# Patient Record
Sex: Female | Born: 1970 | Race: Black or African American | Hispanic: No | Marital: Single | Smoking: Current some day smoker
Health system: Southern US, Community
[De-identification: ages and names within clinical notes are randomized; demographics above are authoritative.]

## PROBLEM LIST (undated history)

## (undated) DIAGNOSIS — E559 Vitamin D deficiency, unspecified: Secondary | ICD-10-CM

## (undated) DIAGNOSIS — I10 Essential (primary) hypertension: Secondary | ICD-10-CM

## (undated) HISTORY — DX: Essential (primary) hypertension: I10

## (undated) HISTORY — DX: Vitamin D deficiency, unspecified: E55.9

## (undated) HISTORY — PX: TUBAL LIGATION: SHX77

---

## 1999-03-15 ENCOUNTER — Ambulatory Visit (HOSPITAL_COMMUNITY): Admission: RE | Admit: 1999-03-15 | Discharge: 1999-03-15 | Payer: Self-pay | Admitting: *Deleted

## 1999-03-22 ENCOUNTER — Encounter: Admission: RE | Admit: 1999-03-22 | Discharge: 1999-03-22 | Payer: Self-pay | Admitting: Obstetrics

## 1999-04-05 ENCOUNTER — Encounter: Admission: RE | Admit: 1999-04-05 | Discharge: 1999-04-05 | Payer: Self-pay | Admitting: Obstetrics

## 2000-05-22 ENCOUNTER — Encounter: Admission: RE | Admit: 2000-05-22 | Discharge: 2000-05-22 | Payer: Self-pay | Admitting: Obstetrics

## 2000-05-30 ENCOUNTER — Ambulatory Visit (HOSPITAL_COMMUNITY): Admission: RE | Admit: 2000-05-30 | Discharge: 2000-05-30 | Payer: Self-pay | Admitting: Obstetrics

## 2000-07-15 ENCOUNTER — Encounter: Admission: RE | Admit: 2000-07-15 | Discharge: 2000-07-15 | Payer: Self-pay | Admitting: Obstetrics & Gynecology

## 2000-08-24 ENCOUNTER — Emergency Department (HOSPITAL_COMMUNITY): Admission: EM | Admit: 2000-08-24 | Discharge: 2000-08-24 | Payer: Self-pay | Admitting: *Deleted

## 2001-02-16 ENCOUNTER — Encounter: Admission: RE | Admit: 2001-02-16 | Discharge: 2001-05-17 | Payer: Self-pay | Admitting: Obstetrics

## 2001-06-23 ENCOUNTER — Emergency Department (HOSPITAL_COMMUNITY): Admission: EM | Admit: 2001-06-23 | Discharge: 2001-06-23 | Payer: Self-pay | Admitting: Emergency Medicine

## 2001-08-05 ENCOUNTER — Emergency Department (HOSPITAL_COMMUNITY): Admission: EM | Admit: 2001-08-05 | Discharge: 2001-08-06 | Payer: Self-pay | Admitting: Emergency Medicine

## 2001-08-06 ENCOUNTER — Encounter: Payer: Self-pay | Admitting: Emergency Medicine

## 2001-08-10 ENCOUNTER — Encounter: Admission: RE | Admit: 2001-08-10 | Discharge: 2001-11-08 | Payer: Self-pay | Admitting: Obstetrics

## 2001-09-01 ENCOUNTER — Encounter: Payer: Self-pay | Admitting: Emergency Medicine

## 2001-09-01 ENCOUNTER — Emergency Department (HOSPITAL_COMMUNITY): Admission: EM | Admit: 2001-09-01 | Discharge: 2001-09-01 | Payer: Self-pay | Admitting: Emergency Medicine

## 2001-11-25 ENCOUNTER — Encounter: Admission: RE | Admit: 2001-11-25 | Discharge: 2002-02-23 | Payer: Self-pay | Admitting: Obstetrics

## 2002-03-10 ENCOUNTER — Emergency Department (HOSPITAL_COMMUNITY): Admission: EM | Admit: 2002-03-10 | Discharge: 2002-03-10 | Payer: Self-pay | Admitting: Emergency Medicine

## 2002-06-25 ENCOUNTER — Ambulatory Visit (HOSPITAL_COMMUNITY): Admission: RE | Admit: 2002-06-25 | Discharge: 2002-06-25 | Payer: Self-pay | Admitting: *Deleted

## 2002-06-25 ENCOUNTER — Encounter: Payer: Self-pay | Admitting: *Deleted

## 2002-08-11 ENCOUNTER — Encounter: Admission: RE | Admit: 2002-08-11 | Discharge: 2002-08-11 | Payer: Self-pay | Admitting: *Deleted

## 2002-09-04 ENCOUNTER — Inpatient Hospital Stay (HOSPITAL_COMMUNITY): Admission: AD | Admit: 2002-09-04 | Discharge: 2002-09-09 | Payer: Self-pay | Admitting: *Deleted

## 2002-09-05 ENCOUNTER — Encounter: Payer: Self-pay | Admitting: Family Medicine

## 2002-11-28 ENCOUNTER — Inpatient Hospital Stay (HOSPITAL_COMMUNITY): Admission: AD | Admit: 2002-11-28 | Discharge: 2002-12-01 | Payer: Self-pay | Admitting: *Deleted

## 2002-11-28 ENCOUNTER — Encounter (INDEPENDENT_AMBULATORY_CARE_PROVIDER_SITE_OTHER): Payer: Self-pay | Admitting: Specialist

## 2002-12-06 ENCOUNTER — Inpatient Hospital Stay (HOSPITAL_COMMUNITY): Admission: AD | Admit: 2002-12-06 | Discharge: 2002-12-06 | Payer: Self-pay | Admitting: Obstetrics and Gynecology

## 2004-08-31 ENCOUNTER — Inpatient Hospital Stay (HOSPITAL_COMMUNITY): Admission: AD | Admit: 2004-08-31 | Discharge: 2004-09-02 | Payer: Self-pay | Admitting: Obstetrics & Gynecology

## 2004-08-31 ENCOUNTER — Encounter (INDEPENDENT_AMBULATORY_CARE_PROVIDER_SITE_OTHER): Payer: Self-pay | Admitting: *Deleted

## 2004-08-31 ENCOUNTER — Ambulatory Visit: Payer: Self-pay | Admitting: Obstetrics & Gynecology

## 2005-10-01 ENCOUNTER — Encounter: Payer: Self-pay | Admitting: Family Medicine

## 2005-10-01 LAB — CONVERTED CEMR LAB
HCT: 32.5 %
Hemoglobin: 11 g/dL
WBC: 5.3 10*3/uL

## 2005-10-03 ENCOUNTER — Ambulatory Visit (HOSPITAL_COMMUNITY): Admission: RE | Admit: 2005-10-03 | Discharge: 2005-10-03 | Payer: Self-pay | Admitting: *Deleted

## 2005-11-28 ENCOUNTER — Inpatient Hospital Stay (HOSPITAL_COMMUNITY): Admission: AD | Admit: 2005-11-28 | Discharge: 2005-11-30 | Payer: Self-pay | Admitting: *Deleted

## 2005-11-28 ENCOUNTER — Ambulatory Visit: Payer: Self-pay | Admitting: Family Medicine

## 2006-12-09 ENCOUNTER — Emergency Department (HOSPITAL_COMMUNITY): Admission: EM | Admit: 2006-12-09 | Discharge: 2006-12-09 | Payer: Self-pay | Admitting: Family Medicine

## 2007-08-17 ENCOUNTER — Emergency Department (HOSPITAL_COMMUNITY): Admission: EM | Admit: 2007-08-17 | Discharge: 2007-08-17 | Payer: Self-pay | Admitting: Family Medicine

## 2007-08-18 ENCOUNTER — Ambulatory Visit: Payer: Self-pay | Admitting: *Deleted

## 2007-09-17 HISTORY — PX: TUBAL LIGATION: SHX77

## 2007-11-09 ENCOUNTER — Ambulatory Visit (HOSPITAL_COMMUNITY): Admission: RE | Admit: 2007-11-09 | Discharge: 2007-11-09 | Payer: Self-pay | Admitting: Family Medicine

## 2007-12-10 ENCOUNTER — Ambulatory Visit: Payer: Self-pay | Admitting: Obstetrics & Gynecology

## 2007-12-11 ENCOUNTER — Ambulatory Visit (HOSPITAL_COMMUNITY): Admission: RE | Admit: 2007-12-11 | Discharge: 2007-12-11 | Payer: Self-pay | Admitting: Family Medicine

## 2007-12-24 ENCOUNTER — Ambulatory Visit: Payer: Self-pay | Admitting: Obstetrics & Gynecology

## 2007-12-25 ENCOUNTER — Inpatient Hospital Stay (HOSPITAL_COMMUNITY): Admission: AD | Admit: 2007-12-25 | Discharge: 2007-12-25 | Payer: Self-pay | Admitting: Gynecology

## 2008-01-07 ENCOUNTER — Ambulatory Visit: Payer: Self-pay | Admitting: Family Medicine

## 2008-01-21 ENCOUNTER — Ambulatory Visit (HOSPITAL_COMMUNITY): Admission: RE | Admit: 2008-01-21 | Discharge: 2008-01-21 | Payer: Self-pay | Admitting: Obstetrics & Gynecology

## 2008-01-21 ENCOUNTER — Ambulatory Visit: Payer: Self-pay | Admitting: Obstetrics & Gynecology

## 2008-02-04 ENCOUNTER — Ambulatory Visit: Payer: Self-pay | Admitting: *Deleted

## 2008-02-12 ENCOUNTER — Ambulatory Visit: Payer: Self-pay | Admitting: Obstetrics and Gynecology

## 2008-02-12 ENCOUNTER — Inpatient Hospital Stay (HOSPITAL_COMMUNITY): Admission: AD | Admit: 2008-02-12 | Discharge: 2008-02-12 | Payer: Self-pay | Admitting: Obstetrics & Gynecology

## 2008-02-18 ENCOUNTER — Ambulatory Visit: Payer: Self-pay | Admitting: Family Medicine

## 2008-02-22 ENCOUNTER — Ambulatory Visit: Payer: Self-pay | Admitting: Family Medicine

## 2008-02-25 ENCOUNTER — Ambulatory Visit: Payer: Self-pay | Admitting: Family Medicine

## 2008-02-29 ENCOUNTER — Ambulatory Visit: Payer: Self-pay | Admitting: Obstetrics and Gynecology

## 2008-02-29 ENCOUNTER — Ambulatory Visit (HOSPITAL_COMMUNITY): Admission: RE | Admit: 2008-02-29 | Discharge: 2008-02-29 | Payer: Self-pay | Admitting: Obstetrics & Gynecology

## 2008-03-03 ENCOUNTER — Ambulatory Visit: Payer: Self-pay | Admitting: Obstetrics & Gynecology

## 2008-03-07 ENCOUNTER — Ambulatory Visit: Payer: Self-pay | Admitting: Obstetrics & Gynecology

## 2008-03-07 ENCOUNTER — Ambulatory Visit (HOSPITAL_COMMUNITY): Admission: RE | Admit: 2008-03-07 | Discharge: 2008-03-07 | Payer: Self-pay | Admitting: Family Medicine

## 2008-03-10 ENCOUNTER — Ambulatory Visit: Payer: Self-pay | Admitting: Family Medicine

## 2008-03-14 ENCOUNTER — Ambulatory Visit: Payer: Self-pay | Admitting: Obstetrics & Gynecology

## 2008-03-17 ENCOUNTER — Ambulatory Visit: Payer: Self-pay | Admitting: Family Medicine

## 2008-03-21 ENCOUNTER — Ambulatory Visit: Payer: Self-pay | Admitting: Obstetrics & Gynecology

## 2008-03-24 ENCOUNTER — Ambulatory Visit: Payer: Self-pay | Admitting: Obstetrics & Gynecology

## 2008-03-28 ENCOUNTER — Ambulatory Visit: Payer: Self-pay | Admitting: Gynecology

## 2008-03-31 ENCOUNTER — Ambulatory Visit: Payer: Self-pay | Admitting: *Deleted

## 2008-04-04 ENCOUNTER — Ambulatory Visit: Payer: Self-pay | Admitting: Obstetrics & Gynecology

## 2008-04-05 ENCOUNTER — Inpatient Hospital Stay (HOSPITAL_COMMUNITY): Admission: RE | Admit: 2008-04-05 | Discharge: 2008-04-07 | Payer: Self-pay | Admitting: *Deleted

## 2008-04-05 ENCOUNTER — Ambulatory Visit: Payer: Self-pay | Admitting: Family Medicine

## 2008-05-19 ENCOUNTER — Encounter: Payer: Self-pay | Admitting: Family Medicine

## 2008-05-19 LAB — CONVERTED CEMR LAB: Pap Smear: NORMAL

## 2008-06-30 ENCOUNTER — Emergency Department (HOSPITAL_COMMUNITY): Admission: EM | Admit: 2008-06-30 | Discharge: 2008-06-30 | Payer: Self-pay | Admitting: Emergency Medicine

## 2009-01-03 ENCOUNTER — Emergency Department (HOSPITAL_COMMUNITY): Admission: EM | Admit: 2009-01-03 | Discharge: 2009-01-03 | Payer: Self-pay | Admitting: Emergency Medicine

## 2009-01-16 ENCOUNTER — Emergency Department (HOSPITAL_COMMUNITY): Admission: EM | Admit: 2009-01-16 | Discharge: 2009-01-16 | Payer: Self-pay | Admitting: Emergency Medicine

## 2009-04-24 ENCOUNTER — Emergency Department (HOSPITAL_COMMUNITY): Admission: EM | Admit: 2009-04-24 | Discharge: 2009-04-24 | Payer: Self-pay | Admitting: Emergency Medicine

## 2009-06-06 ENCOUNTER — Emergency Department (HOSPITAL_COMMUNITY): Admission: EM | Admit: 2009-06-06 | Discharge: 2009-06-06 | Payer: Self-pay | Admitting: Emergency Medicine

## 2009-07-25 ENCOUNTER — Encounter: Payer: Self-pay | Admitting: Family Medicine

## 2009-09-14 IMAGING — US US OB FOLLOW-UP
2 series · 14 of 28 positions shown · non-contrast
Comparison: none

OBSTETRICAL ULTRASOUND:
 This ultrasound exam was performed in the [HOSPITAL] Ultrasound Department.  The OB US report was generated in the AS system, and faxed to the ordering physician.  This report is also available in [REDACTED] PACS.

[Series 1: us ob re-eval · 9 of 34 slices shown (1 of 2)]
[im 2/34]
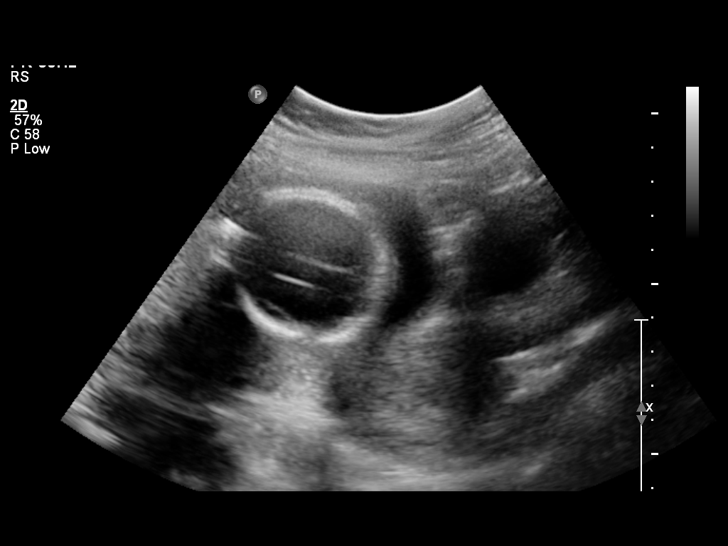
[im 6/34]
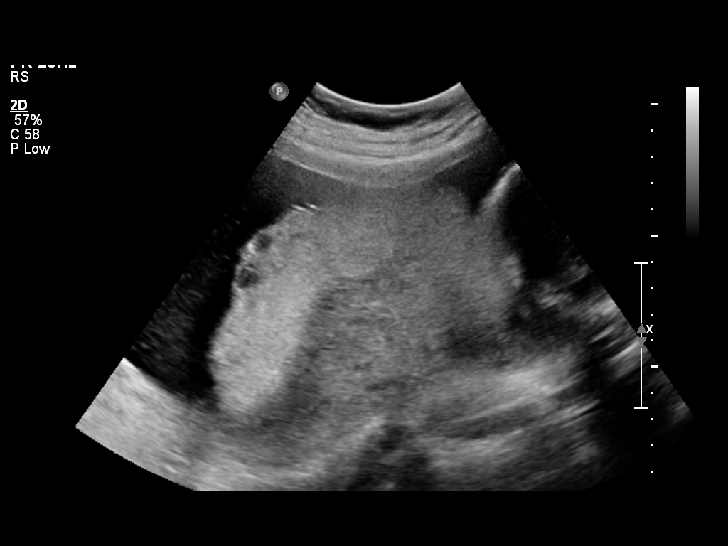
[im 10/34]
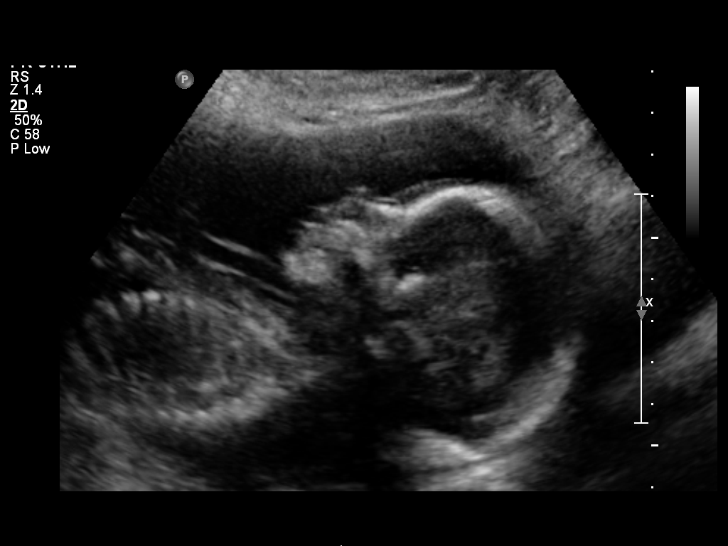
[im 13/34]
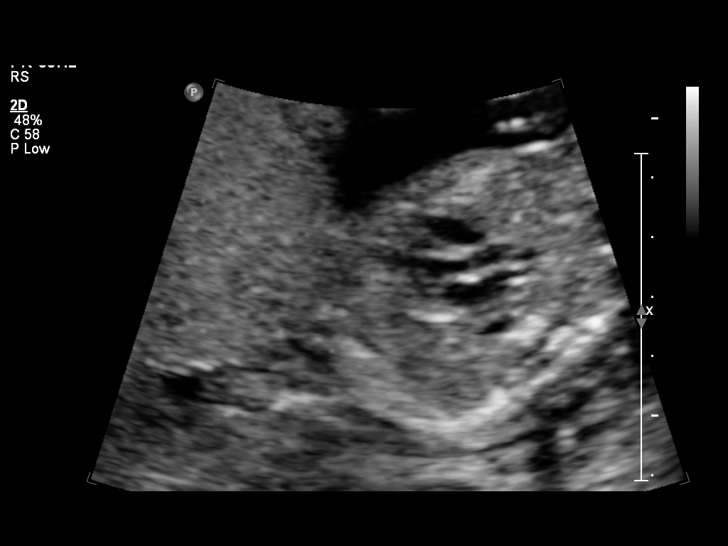
[im 17/34]
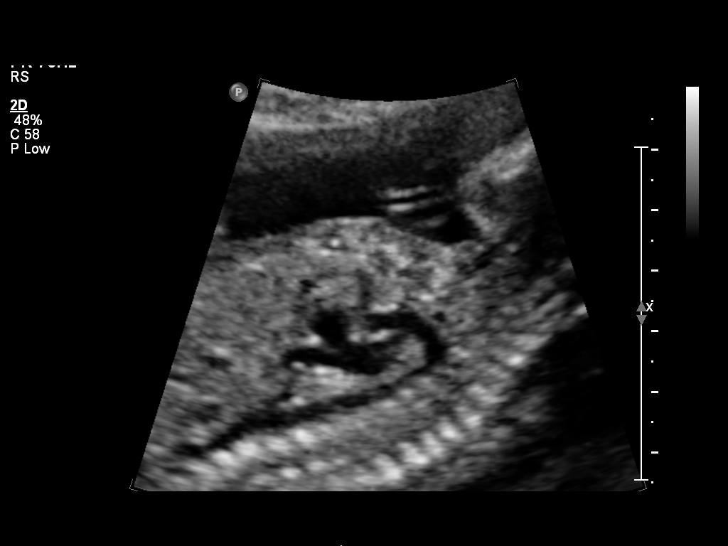
[im 21/34]
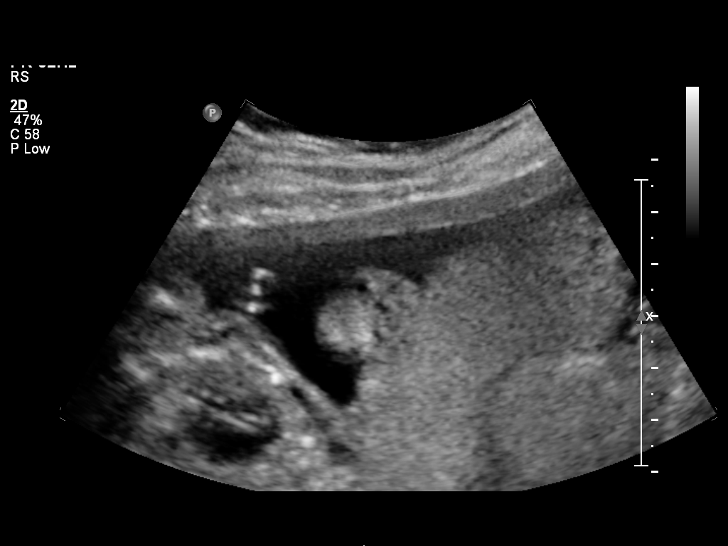
[im 24/34]
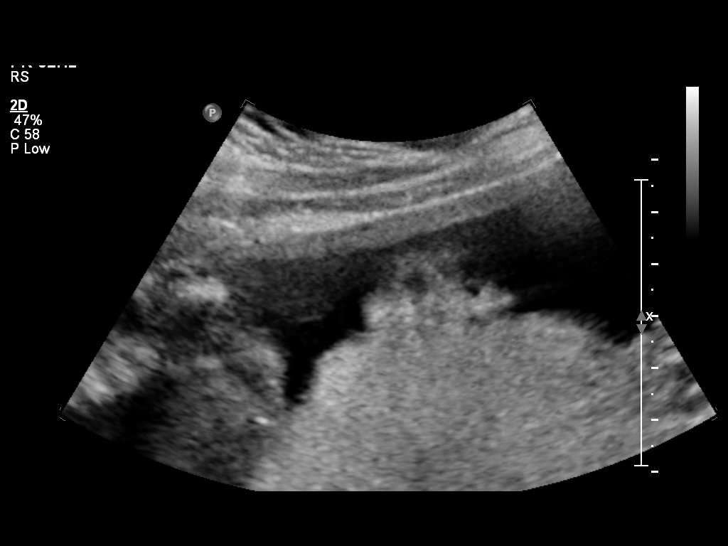
[im 28/34]
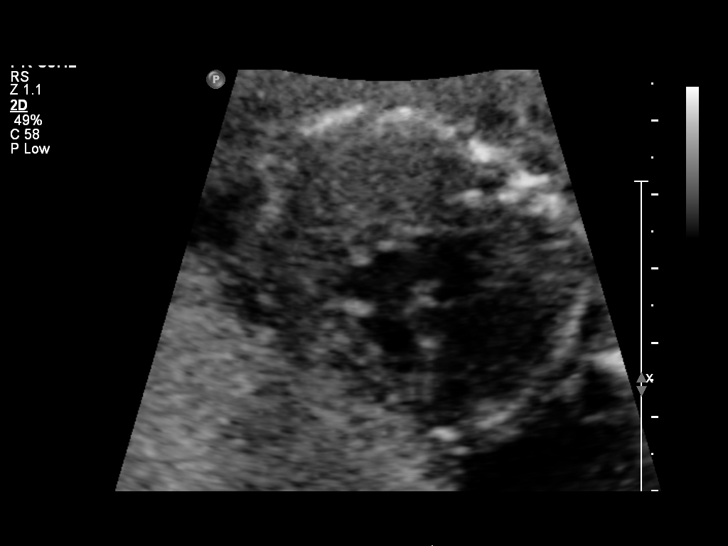
[im 32/34]
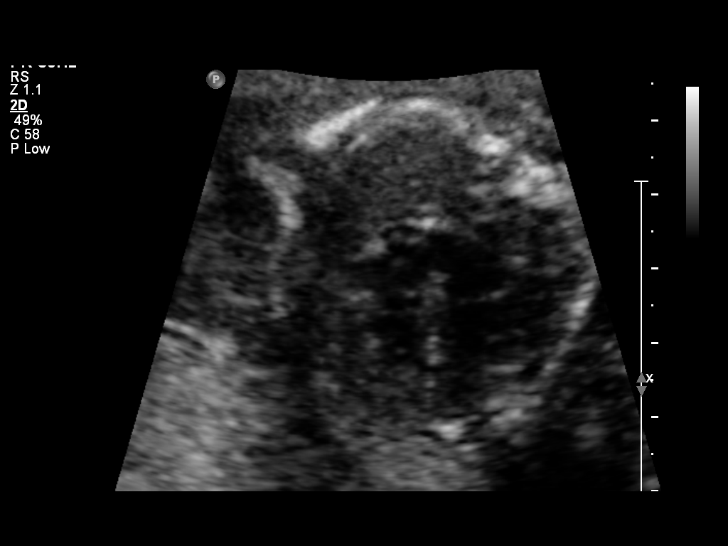

[Series 1: us ob re-eval · 5 of 15 slices shown (2 of 2)]
[im 1/15]
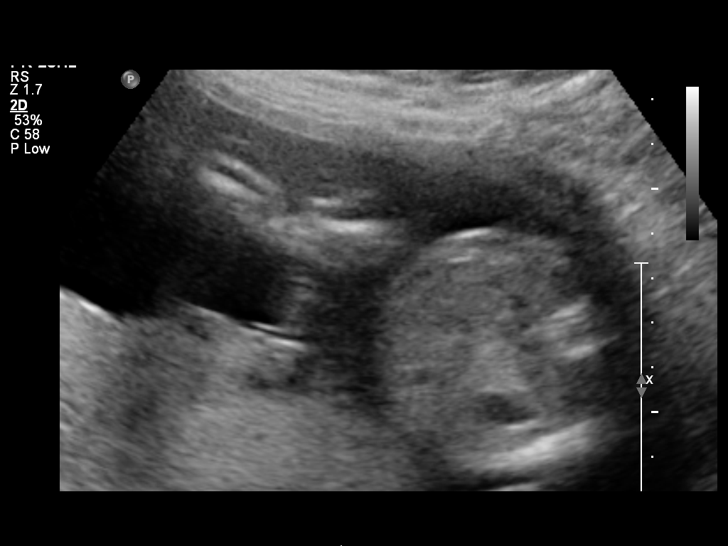
[im 4/15]
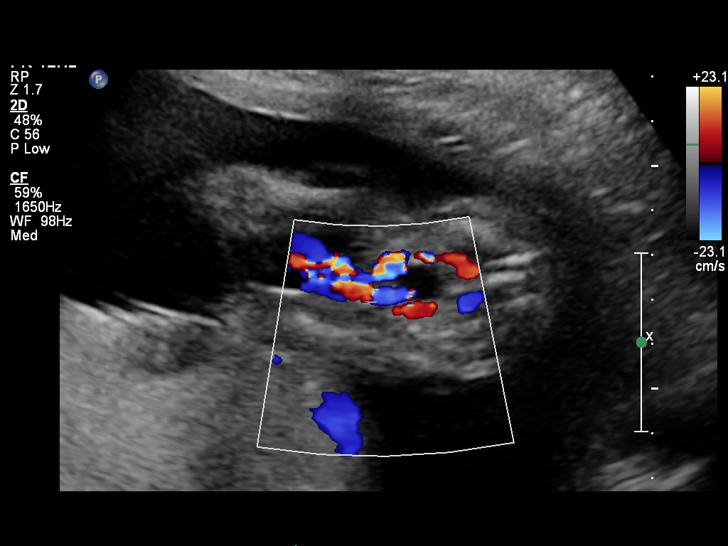
[im 8/15]
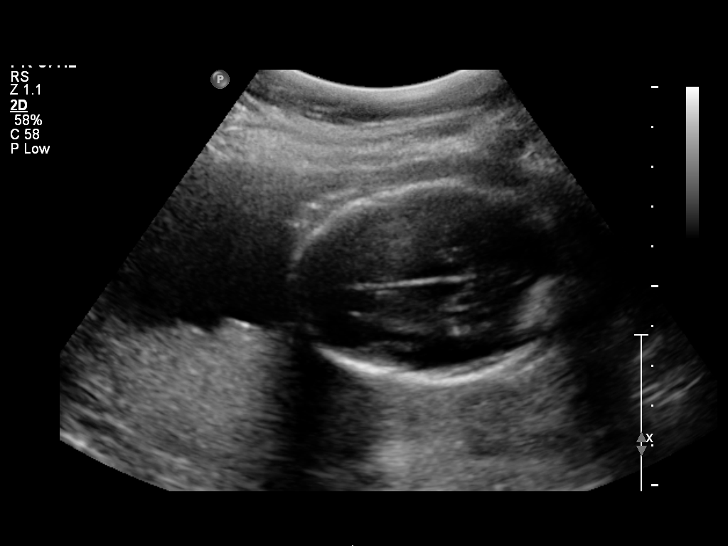
[im 11/15]
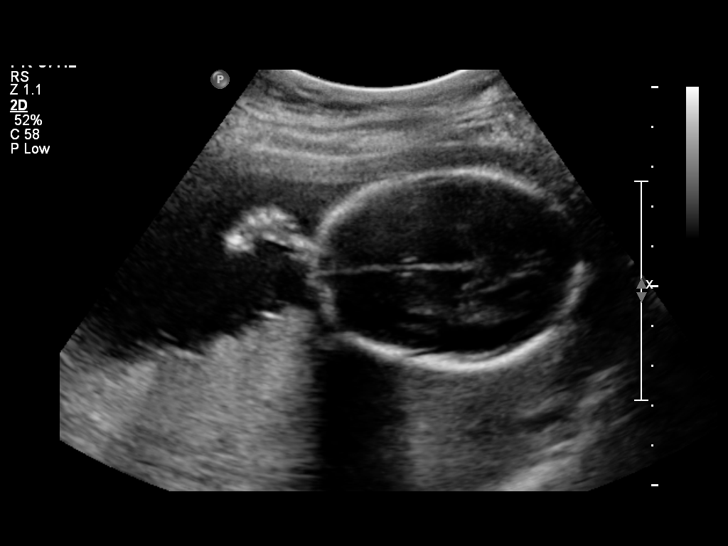
[im 15/15]
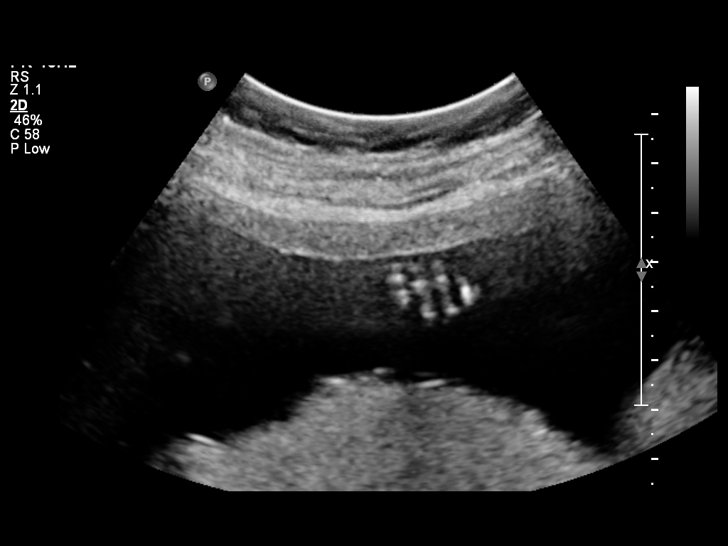

[14 of 28 positions shown; findings below may reference images not displayed]

IMPRESSION: See AS Obstetric US report.

## 2009-12-14 ENCOUNTER — Encounter: Payer: Self-pay | Admitting: Family Medicine

## 2009-12-14 ENCOUNTER — Ambulatory Visit (HOSPITAL_COMMUNITY): Admission: RE | Admit: 2009-12-14 | Discharge: 2009-12-14 | Payer: Self-pay | Admitting: Family Medicine

## 2009-12-14 ENCOUNTER — Ambulatory Visit: Payer: Self-pay | Admitting: Family Medicine

## 2009-12-14 DIAGNOSIS — F329 Major depressive disorder, single episode, unspecified: Secondary | ICD-10-CM | POA: Insufficient documentation

## 2009-12-15 ENCOUNTER — Ambulatory Visit: Payer: Self-pay | Admitting: Cardiology

## 2009-12-15 DIAGNOSIS — G4730 Sleep apnea, unspecified: Secondary | ICD-10-CM

## 2009-12-15 DIAGNOSIS — E669 Obesity, unspecified: Secondary | ICD-10-CM | POA: Insufficient documentation

## 2010-01-03 ENCOUNTER — Ambulatory Visit (HOSPITAL_BASED_OUTPATIENT_CLINIC_OR_DEPARTMENT_OTHER): Admission: RE | Admit: 2010-01-03 | Discharge: 2010-01-03 | Payer: Self-pay | Admitting: Cardiology

## 2010-01-03 ENCOUNTER — Encounter: Payer: Self-pay | Admitting: Cardiology

## 2010-01-04 ENCOUNTER — Ambulatory Visit (HOSPITAL_COMMUNITY): Admission: RE | Admit: 2010-01-04 | Discharge: 2010-01-04 | Payer: Self-pay | Admitting: Cardiology

## 2010-01-04 ENCOUNTER — Encounter: Payer: Self-pay | Admitting: Cardiology

## 2010-01-04 ENCOUNTER — Ambulatory Visit: Payer: Self-pay

## 2010-01-04 ENCOUNTER — Ambulatory Visit: Payer: Self-pay | Admitting: Cardiology

## 2010-01-11 ENCOUNTER — Telehealth: Payer: Self-pay | Admitting: Family Medicine

## 2010-01-11 ENCOUNTER — Ambulatory Visit: Payer: Self-pay | Admitting: Family Medicine

## 2010-01-11 DIAGNOSIS — K219 Gastro-esophageal reflux disease without esophagitis: Secondary | ICD-10-CM

## 2010-01-13 ENCOUNTER — Ambulatory Visit: Payer: Self-pay | Admitting: Pulmonary Disease

## 2010-01-18 ENCOUNTER — Telehealth: Payer: Self-pay | Admitting: Cardiology

## 2010-01-18 ENCOUNTER — Encounter: Payer: Self-pay | Admitting: Family Medicine

## 2010-02-09 ENCOUNTER — Ambulatory Visit: Payer: Self-pay | Admitting: Pulmonary Disease

## 2010-02-09 DIAGNOSIS — G4733 Obstructive sleep apnea (adult) (pediatric): Secondary | ICD-10-CM | POA: Insufficient documentation

## 2010-02-19 ENCOUNTER — Encounter: Payer: Self-pay | Admitting: Cardiology

## 2010-02-20 ENCOUNTER — Ambulatory Visit: Payer: Self-pay | Admitting: Cardiology

## 2010-04-06 ENCOUNTER — Ambulatory Visit: Payer: Self-pay | Admitting: Family Medicine

## 2010-04-06 DIAGNOSIS — F17200 Nicotine dependence, unspecified, uncomplicated: Secondary | ICD-10-CM

## 2010-04-06 DIAGNOSIS — M25569 Pain in unspecified knee: Secondary | ICD-10-CM

## 2010-04-28 ENCOUNTER — Encounter: Payer: Self-pay | Admitting: Pulmonary Disease

## 2010-08-09 ENCOUNTER — Encounter: Payer: Self-pay | Admitting: Pulmonary Disease

## 2010-10-18 NOTE — Letter (Signed)
Summary: CMN for CPAP Supplies/[facility]  CMN for CPAP Supplies/[facility]   Imported By: Sherian Rein 08/17/2010 08:17:11  _____________________________________________________________________  External Attachment:    Type:   Image     Comment:   External Document

## 2010-10-18 NOTE — Progress Notes (Signed)
Summary: sleep study results  Phone Note Call from Patient Call back at Home Phone [phone]   Caller: Patient Reason for Call: Talk to Nurse, Lab or Test Results Summary of Call: wants to know the results of her sleep study  Initial call taken by: Migdalia Dk,  Jan 18, 2010 8:42 AM  Follow-up for Phone Call        Mild sleep apnea. Refer to sleep MD 01/23/10--1640--referrel made to dr clance --appoint made for 02/08/10 10:30--pt notified and agrees to appoint--nt--all notes faxed--nt Follow-up by: Rollene Rotunda, MD, [employer],  Jan 22, 2010 1:49 PM

## 2010-10-18 NOTE — Assessment & Plan Note (Signed)
Summary: per check out/sf   Visit Type:  Follow-up Primary Provider:  Ardeen Garland  MD  CC:  Dyspnea.  History of Present Illness: The patient presents for evaluation of dyspnea. Her complaints are described in the previous note.  I referred her for an echocardiogram which demonstrated a well preserved ejection fraction and no significant valvular abnormalities. She also had a BNP which was normal. I did send her for a sleep study which demonstrated mild sleep apnea and she has since seen Dr. Shelle Iron.  She has just been started on CPAP. She's not having any chest pain. She has not had any PND or orthopnea. She is trying to walk more for exercise and has lost 7 pounds.  Current Medications (verified): 1)  Citalopram Hydrobromide 20 Mg Tabs (Citalopram Hydrobromide) .Marland Kitchen.. 1 Tab By Mouth By Mouth Daily For Depression 2)  Prilosec Otc 20 Mg Tbec (Omeprazole Magnesium) .... Take 1 Tab By Mouth Daily  Allergies (verified): No Known Drug Allergies  Past History:  Past Medical History: Anemia Depression - follows at the [facility] 5 children - 2 c-sections Hypertension (new) Sleep apnea  Past Surgical History: Reviewed history from 12/14/2009 and no changes required. C-section x 2 BTL in 2009  Review of Systems       As stated in the HPI and negative for all other systems.   Vital Signs:  Patient profile:   40 year old female Height:      64.5 inches Weight:      283 pounds BMI:     48.00 Pulse rate:   80 / minute Resp:     18 per minute BP sitting:   112 / 72  (left arm)  Vitals Entered By: Marrion Coy, CNA (February 20, 2010 4:24 PM)  Physical Exam  General:  Well developed, well nourished, in no acute distress.obese.   Head:  normocephalic and atraumatic Neck:  Neck supple, no JVD. No masses, thyromegaly or abnormal cervical nodes. Lungs:  Clear bilaterally to auscultation and percussion. Heart:  Non-displaced PMI, chest non-tender; regular rate and rhythm, S1, S2  without murmurs, rubs or gallops. Carotid upstroke normal, no bruit. Normal abdominal aortic size, no bruits. Femorals normal pulses, no bruits. Pedals normal pulses. No edema, no varicosities. Abdomen:  Bowel sounds positive; abdomen soft and non-tender without masses, organomegaly, or hernias noted. No hepatosplenomegaly, obese Msk:  Back normal, normal gait. Muscle strength and tone normal. Extremities:  No clubbing or cyanosis. Neurologic:  Alert and oriented x 3. Psych:  Normal affect.   Impression & Recommendations:  Problem # 1:  SHORTNESS OF BREATH (ICD-786.05) At this point I don't see a cardiac etiology. Her dyspnea is probably related to decreased exercise tolerance and morbid obesity. No further workup is suggested.  Problem # 2:  ESSENTIAL HYPERTENSION, BENIGN (ICD-401.1) Her blood pressure is controlled and she will continue the meds as listed.  Patient Instructions: 1)  Your physician recommends that you schedule a follow-up appointment as needed 2)  Your physician recommends that you continue on your current medications as directed. Please refer to the Current Medication list given to you today.

## 2010-10-18 NOTE — Letter (Signed)
Summary: [facility]  [facility]   Imported By: Lester [place] 02/23/2010 08:41:57  _____________________________________________________________________  External Attachment:    Type:   Image     Comment:   External Document

## 2010-10-18 NOTE — Assessment & Plan Note (Signed)
Summary: heartburn/Purdy/mayans   Vital Signs:  Patient profile:   41 year old female Height:      64.5 inches Weight:      290 pounds BMI:     49.19 BSA:     2.31 Temp:     98.5 degrees F Pulse rate:   75 / minute BP sitting:   123 / 85  Vitals Entered By: Jone Baseman CMA (January 11, 2010 3:06 PM) CC: heartburn x 2 days Is Patient Diabetic? No Pain Assessment Patient in pain? no        Primary Care Provider:  Ardeen Garland  MD  CC:  heartburn x 2 days.  History of Present Illness: 40 yo female coming in with a history of heartburn x 2 days.  Pain will last 2-3 hours.  Pt states that she is having pain substernal region intermittantly may be contributed to timing of food but pt states it doesn't happen all the time.  Pt states the pain stays in same regioin, does not radiate, does not cause her to be sob.  Pt denies that it is positional.  Pt has not had any vomting, no numbness or tingling.  Pt deneis waking up with a metallic taste in her mouth but is sure that this is heartburn.  Pt recently did have an ECHO done for dyspnea which was normal.  Pt has tried tums without improvement.  BNP was <30.  Habits & Providers  Alcohol-Tobacco-Diet     Tobacco Status: current     Tobacco Counseling: to quit use of tobacco products     Cigarette Packs/Day: 0.5  Current Medications (verified): 1)  Citalopram Hydrobromide 20 Mg Tabs (Citalopram Hydrobromide) .Marland Kitchen.. 1 Tab By Mouth By Mouth Daily For Depression 2)  Prilosec Otc 20 Mg Tbec (Omeprazole Magnesium) .... Take 1 Tab By Mouth Daily  Allergies (verified): No Known Drug Allergies  Past History:  Past medical, surgical, family and social histories (including risk factors) reviewed, and no changes noted (except as noted below).  Past Medical History: Reviewed history from 12/15/2009 and no changes required. Anemia Depression - follows at the [facility] 5 children - 2 c-sections Hypertension (new)  Past Surgical  History: Reviewed history from 12/14/2009 and no changes required. C-section x 2 BTL in 2009  Family History: Reviewed history from 12/15/2009 and no changes required. Father died when she was a baby - unknown cause. Not close with brother.  Mother healthy  Social History: Reviewed history from 12/14/2009 and no changes required. Was in prison for 8 months in 2000. Has 5 children.  4 live with her mother, youngest lives with her.  Quit smoking 2 years ago but then restarted 7 months ago  Review of Systems       denies fever, chills, nausea, vomiting, diarrhea or constipation   Physical Exam  General:  obese, alert, NAD vitals reviewed Eyes:  pupils equal, pupils round, and pupils reactive to light.   Mouth:  MMM Lungs:  CTAB Heart:  Normal rate and regular rhythm. S1 and S2 normal without gallop, murmur, click, rub or other extra sounds. Abdomen:  tender to palpation mid epiigastric region, BS + ND, obese Extremities:  no edema   Impression & Recommendations:  Problem # 1:  GASTROESOPHAGEAL REFLUX DISEASE, MILD (ICD-530.81) will give trial of PPI.  Consider increasing if not better.  Cardiac seemed to ruled out but if dyspnea continue would consider further w/u with holter monitor.  If doesn't improve pt to return in  1-2 weeks.   Her updated medication list for this problem includes:    Prilosec Otc 20 Mg Tbec (Omeprazole magnesium) .Marland Kitchen... Take 1 tab by mouth daily  Orders: [facility]- Est Level  3 ([address])  Complete Medication List: 1)  Citalopram Hydrobromide 20 Mg Tabs (Citalopram hydrobromide) .Marland Kitchen.. 1 tab by mouth by mouth daily for depression 2)  Prilosec Otc 20 Mg Tbec (Omeprazole magnesium) .... Take 1 tab by mouth daily Prescriptions: PRILOSEC OTC 20 MG TBEC (OMEPRAZOLE MAGNESIUM) take 1 tab by mouth daily  #32 x 3   Entered and Authorized by:   Antoine Primas DO   Signed by:   Antoine Primas DO on 01/11/2010   Method used:   Electronically to        [id number] (retail)       [address]       [place], [place]  [id number]       Ph: [phone]       Fax: [phone]   RxID:   [id number]

## 2010-10-18 NOTE — Assessment & Plan Note (Signed)
Summary: consult for osa   Copy to:  Rollene Rotunda Primary Provider/Referring Provider:  Ardeen Garland  MD  CC:  Sleep Consult.  History of Present Illness: The pt is a 40y/o female who I have been asked to see for management of osa.  She recently had npsg which showed mild osa with AHI and12/hr and desat to 83%.  She also had large numbers of leg jerks with sleep disruption, however unclear if primarily related to her sleep apnea.  The pt has been noted to have loud snoring, but no one has mentioned pauses in her breathing during sleep.  She does have frequent awakenings during the night, and is not rested upon arising in the am's.  She goes to bed at 9-10pm, and arises at 6am.  She denies any RLS symptoms, and no one has mentioned that she kicks a lot during the night.  She does clerical work, and does note sleep pressure during the day with periods of inactivity.  She does think this results in some interference with her alertness and concentration during the day.  She denies any sleepiness issues with watching tv in the evening.  She does not drive, but does get sleepy as a rider in a car.  She thinks her weight has increased the last 2 years, but unsure how much.  Her recent epworth score was normal at 5.  Medications Prior to Update: 1)  Citalopram Hydrobromide 20 Mg Tabs (Citalopram Hydrobromide) .Marland Kitchen.. 1 Tab By Mouth By Mouth Daily For Depression 2)  Prilosec Otc 20 Mg Tbec (Omeprazole Magnesium) .... Take 1 Tab By Mouth Daily  Allergies (verified): No Known Drug Allergies  Past History:  Past Medical History: Reviewed history from 12/15/2009 and no changes required. Anemia Depression - follows at the [facility] 5 children - 2 c-sections Hypertension (new)  Past Surgical History: Reviewed history from 12/14/2009 and no changes required. C-section x 2 BTL in 2009  Family History: Reviewed history from 12/15/2009 and no changes required. Father died when she was a baby -  unknown cause. Not close with brother.  Mother healthy maternal grandmother with breast cancer  Social History: Reviewed history from 12/14/2009 and no changes required. Was in prison for 8 months in 2000.   pt is single. pt has children. pt works in Warehouse manager. pt is a current smoker.  less than 1 ppd.  started at age 63.   Review of Systems       The patient complains of shortness of breath with activity, chest pain, acid heartburn, headaches, and hand/feet swelling.  The patient denies shortness of breath at rest, productive cough, non-productive cough, coughing up blood, irregular heartbeats, indigestion, loss of appetite, weight change, abdominal pain, difficulty swallowing, sore throat, tooth/dental problems, nasal congestion/difficulty breathing through nose, sneezing, itching, ear ache, anxiety, depression, joint stiffness or pain, rash, change in color of mucus, and fever.    Vital Signs:  Patient profile:   40 year old female Height:      64.5 inches Weight:      285 pounds BMI:     48.34 O2 Sat:      97 % on Room air Temp:     98.3 degrees F oral Pulse rate:   101 / minute BP sitting:   124 / 70  (left arm) Cuff size:   large  Vitals Entered By: Arman Filter LPN (Feb 09, 2010 1:32 PM)  O2 Flow:  Room air CC: Sleep Consult Comments Medications reviewed with patient  Aundra Millet Reynolds LPN  Feb 09, 2010 1:35 PM    Physical Exam  General:  obese female in nad Eyes:  PERRLA and EOMI.   Nose:  turbinate hypertrophy, no obstruction Mouth:  long palate and uvula, no tonsillar hypertrophy Neck:  no jvd, tmg, LN Lungs:  clear to auscultation Heart:  rrr, no mrg Abdomen:  soft and nontender, bs+ Extremities:  no significant edema, pulses intact distally no cyanosis Neurologic:  alert and oriented, moves all 4.   Impression & Recommendations:  Problem # 1:  OBSTRUCTIVE SLEEP APNEA (ICD-327.23) the pt has mild osa by her recent sleep study, and is symptomatic at night  and somewhat during the day.  I have discussed with her weight loss, upper airway surgery, dental appliance, and cpap.  Given the mild nature of her disease, at least this isn't a CV risk for her.  I would base the aggressiveness of treatment on the degree this is affecting her QOL.  The pt would like to treat her osa more aggessively with cpap while trying to work on weight loss.  Will set her up on cpap and have her f/u in 4 weeks.  If she continues to have nonrestorative sleep despite therapeutic cpap, would consider whether her leg movements could be an issue.  Other Orders: Consultation Level IV ([address]109) DME Referral (DME)  Patient Instructions: 1)  will set up on cpap as we discussed.  Please call if having tolerance issues 2)  work on weight loss 3)  keep in mind leg kicks, and let me know if you are having these. 4)  followup with me in 4 weeks.

## 2010-10-18 NOTE — Miscellaneous (Signed)
Summary: Orders Update  Clinical Lists Changes  Orders: Added new Test order of T-BNP  (B Natriuretic Peptide) ([id number]) - Signed 

## 2010-10-18 NOTE — Letter (Signed)
Summary: CMN/[facility]  CMN/[facility]   Imported By: Lester [place] 05/02/2010 09:43:09  _____________________________________________________________________  External Attachment:    Type:   Image     Comment:   External Document

## 2010-10-18 NOTE — Assessment & Plan Note (Signed)
Summary: f/u/kh   Vital Signs:  Patient profile:   40 year old female Height:      64.5 inches Weight:      278 pounds BMI:     47.15 BSA:     2.26 Temp:     98.2 degrees F Pulse rate:   66 / minute BP sitting:   121 / 81  Vitals Entered By: Jone Baseman CMA (April 06, 2010 1:41 PM) CC: f/u Is Patient Diabetic? No Pain Assessment Patient in pain? yes     Location: left knee Intensity: 7   Referring Provider:  Rollene Rotunda Primary Provider:  Demetria Pore MD  CC:  f/u.  History of Present Illness: Pt presents today with complaint of knee pain and need for prilosec and citalapram refills. 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 The pain is in both knees. It is in the back of her left knee and along the MCL joint area. Her left knee pain is more sharp in nature. Her right kne pain is diffusely around the front of the knee (patellar) and is more dull. She notes some mild BLE swelling at times. She says her pain is bad all of the time, but worst in the morning when she first wakes up and it gradually gets a little better as the day goes on and she is moving more. She has tried Advil, Tylenol, and Motrin as well as Icy-Hot, none of which have given her any relief. No numbness or tingling. She notes some swelling in her right knee at times. No muscle weakness. No falls or recent trauma. Selena Batten has lost 5lbs!  Current Problems (verified): 1)  Cigarette Smoker  (ICD-305.1) 2)  Knee Pain, Bilateral  (ICD-719.46) 3)  Obstructive Sleep Apnea  (ICD-327.23) 4)  Gastroesophageal Reflux Disease, Mild  (ICD-530.81) 5)  Obesity, Unspecified  (ICD-278.00) 6)  Sleep Apnea  (ICD-780.57) 7)  Depression  (ICD-311)  Current Medications (verified): 1)  Citalopram Hydrobromide 20 Mg Tabs (Citalopram Hydrobromide) .Marland Kitchen.. 1 Tab By Mouth By Mouth Daily For Depression 2)  Prilosec Otc 20 Mg Tbec  (Omeprazole Magnesium) .... Take 1 Tab By Mouth Daily 3)  Voltaren-Xr 100 Mg Xr24h-Tab (Diclofenac Sodium) .... Take 1 Tab By Mouth Two Times A Day  Allergies (verified): No Known Drug Allergies  Physical Exam  General:  alert, well-developed, well-nourished, well-hydrated, and overweight-appearing.   Lungs:  normal respiratory effort, normal breath sounds, no crackles, and no wheezes.   Heart:  normal rate, regular rhythm, no murmur, no gallop, and no rub.   Abdomen:  non-tender and normal bowel sounds.   Msk:  normal ROM, no joint tenderness, no joint swelling, no joint deformities, and no crepitation.  no TTP along posterior knee bilat or along patella or ligaments.  Pulses:  R dorsalis pedis normal and L dorsalis pedis normal.   Neurologic:  alert & oriented X3.  strength normal in BLE.    Impression & Recommendations:  Problem # 1:  KNEE PAIN, BILATERAL (ICD-719.46) Assessment New  Appears to be arthritic pain. Will have pt f/u in 1 month to discuss. If it has not improved at that time, will consider standing AP and lateral knee films and sports medicine clinic referral. Discussed the risks of long term NSAID therapy. Encouraged weight loss.  Her updated medication list for this problem includes:    Voltaren-xr 100 Mg Xr24h-tab (Diclofenac  sodium) .Marland Kitchen... Take 1 tab by mouth two times a day  Orders: [facility]- Est  Level 4 (62130)  Problem # 2:  DEPRESSION (ICD-311)  Stable. Medication refilled.  Her updated medication list for this problem includes:    Citalopram Hydrobromide 20 Mg Tabs (Citalopram hydrobromide) .Marland Kitchen... 1 tab by mouth by mouth daily for depression  Orders: [facility]- Est  Level 4 (86578)  Problem # 3:  GASTROESOPHAGEAL REFLUX DISEASE, MILD (ICD-530.81)  Stable. Medication refilled.  Her updated medication list for this problem includes:    Prilosec Otc 20 Mg Tbec (Omeprazole magnesium) .Marland Kitchen... Take 1 tab by mouth daily  Orders: [facility]- Est  Level 4 (46962)  Problem #  4:  OBESITY, UNSPECIFIED (ICD-278.00)  Pt has lost 5 lb. Praised pt for this change and encouraged her to continue.   Orders: [facility]- Est  Level 4 (99214)  Problem # 5:  CIGARETTE SMOKER (ICD-305.1)  1/2 ppd. Slightly increased. Encouraged her to start thinking about quitting. Will continue addressing this issue at future visits.   Orders: [facility]- Est  Level 4 (95284)  Complete Medication List: 1)  Citalopram Hydrobromide 20 Mg Tabs (Citalopram hydrobromide) .Marland Kitchen.. 1 tab by mouth by mouth daily for depression 2)  Prilosec Otc 20 Mg Tbec (Omeprazole magnesium) .... Take 1 tab by mouth daily 3)  Voltaren-xr 100 Mg Xr24h-tab (Diclofenac sodium) .... Take 1 tab by mouth two times a day  Patient Instructions: 1)  I have refilled your prilosec and citalopram prescriptions. 2)  I have given you a new prescription for an anti-inflammatory medication to help with the pain in your knees.  3)  Please schedule a follow-up appointment in 1 month so we can talk about how your knee pain is doing. 4)  Tobacco is very bad for your health and your loved ones! You Should stop smoking!. 5)  Great job on loosing 5 pounds, keep up the great work! 6)  It was great meeting you! Prescriptions: VOLTAREN-XR 100 MG XR24H-TAB (DICLOFENAC SODIUM) Take 1 tab by mouth two times a day  #70 x 0   Entered and Authorized by:   Demetria Pore MD   Signed by:   Demetria Pore MD on 04/06/2010   Method used:   Print then Give to Patient   RxID:   [id number] PRILOSEC OTC 20 MG TBEC (OMEPRAZOLE MAGNESIUM) take 1 tab by mouth daily  #30 x 3   Entered and Authorized by:   Demetria Pore MD   Signed by:   Demetria Pore MD on 04/06/2010   Method used:   Print then Give to Patient   RxID:   [id number] CITALOPRAM HYDROBROMIDE 20 MG TABS (CITALOPRAM HYDROBROMIDE) 1 tab by mouth by mouth daily for depression  #30 x 5   Entered and Authorized by:   Demetria Pore MD   Signed by:   Demetria Pore MD on  04/06/2010   Method used:   Print then Give to Patient   RxID:   [id number]

## 2010-10-18 NOTE — Miscellaneous (Signed)
Summary: Outside records  Clinical Lists Changes  Observations: Added new observation of PAP SMEAR: normal (07/25/2009 10:41) Added new observation of PAP SMEAR: normal (05/19/2008 10:41) Added new observation of PLATELETK/UL: 228 K/uL (10/01/2005 10:41) Added new observation of HCT: 32.5 % (10/01/2005 10:41) Added new observation of HGB: 11.0 g/dL (16/06/9603 54:09) Added new observation of WBC COUNT: 5.3 10*3/microliter (10/01/2005 10:41)

## 2010-10-18 NOTE — Miscellaneous (Signed)
Clinical Lists Changes  Observations: Added new observation of ECHOINTERP:  - Left ventricle: The cavity size was normal. Wall thickness was       increased in a pattern of mild LVH. Systolic function was normal.       The estimated ejection fraction was in the range of 55% to 60%.       Wall motion was normal; there were no regional wall motion       abnormalities. Features are consistent with a pseudonormal left       ventricular filling pattern, with concomitant abnormal relaxation       and increased filling pressure (grade 2 diastolic dysfunction).     - Left atrium: The atrium was mildly dilated. (01/04/2010 9:27) Added new observation of SLEEP STUDY:  1. Mild obstructive sleep apnea/hypopnea syndrome with an AH I of 12       events per hour and oxygen desaturation as low as 83%.  The patient       did not meet split-night criteria secondary to the majority of her       events occurring well after midnight.  Treatment for this degree of       sleep apnea can include weight loss alone, upper airway surgery,       dental appliance, and also a CPAP.  The decision to treat this       degree of sleep apnea aggressively should be dependent upon its       impact to the patient's quality of life.   2. Large numbers of periodic leg movements with mild sleep disruption.       It is unclear whether this is related to       the patient's sleep disordered breathing or whether she may have a       concomitant primary movement disorder of sleep.  Clinical       correlation is suggested after the patient is adequately treated       her obstructive sleep apnea.           (01/03/2010 9:29)      Echocardiogram  Procedure date:  01/04/2010  Findings:       - Left ventricle: The cavity size was normal. Wall thickness was       increased in a pattern of mild LVH. Systolic function was normal.       The estimated ejection fraction was in the range of 55% to 60%.       Wall motion was  normal; there were no regional wall motion       abnormalities. Features are consistent with a pseudonormal left       ventricular filling pattern, with concomitant abnormal relaxation       and increased filling pressure (grade 2 diastolic dysfunction).     - Left atrium: The atrium was mildly dilated.  Sleep Study  Procedure date:  01/03/2010  Findings:       1. Mild obstructive sleep apnea/hypopnea syndrome with an AH I of 12       events per hour and oxygen desaturation as low as 83%.  The patient       did not meet split-night criteria secondary to the majority of her       events occurring well after midnight.  Treatment for this degree of       sleep apnea can include weight loss alone, upper airway surgery,       dental appliance, and also  a CPAP.  The decision to treat this       degree of sleep apnea aggressively should be dependent upon its       impact to the patient's quality of life.   2. Large numbers of periodic leg movements with mild sleep disruption.       It is unclear whether this is related to       the patient's sleep disordered breathing or whether she may have a       concomitant primary movement disorder of sleep.  Clinical       correlation is suggested after the patient is adequately treated       her obstructive sleep apnea.

## 2010-10-18 NOTE — Progress Notes (Signed)
Summary: triage  Phone Note Call from Patient Call back at Home Phone [phone]   Caller: Patient Summary of Call: having heartburn real bad yesterday & today - not sure if she should come in today Initial call taken by: De Nurse,  January 11, 2010 1:36 PM  Follow-up for Phone Call        has tried tums. wants to be seen. work in at 3. aware of wait Follow-up by: Golden Circle RN,  January 11, 2010 1:44 PM

## 2010-10-18 NOTE — Assessment & Plan Note (Signed)
Summary: np6/dx:CP/dypsnea on excertion/bilateral leg edema/lg   Visit Type:  Follow-up Primary Provider:  Ardeen Garland  MD  CC:  Dyspnea.  History of Present Illness: The patient presents for evaluation of shortness of breath.  She has no prior cardiac history. However, slowly over many months or years she has had increasing shortness of breath. She is now short of breath walking 25 yards on level ground. She does not report an abrupt change. She is not describing new PND or orthopnea. She knows that she does snore in her sleep. She has daytime somnolence and headaches. She does get chest discomfort. This is episodic stabbing and squeezing discomfort that occurs at rest. It may happen with different positions. It can last for several minutes at a time. She cannot bring this on with activity. She does not have associated nausea vomiting or diaphoresis with the discomfort though she does have diaphoresis otherwise as a routine. She has had slow weight gain. She's had no prior cardiac workup.  Current Medications (verified): 1)  Citalopram Hydrobromide 20 Mg Tabs (Citalopram Hydrobromide) .Marland Kitchen.. 1 Tab By Mouth By Mouth Daily For Depression  Allergies (verified): No Known Drug Allergies  Past History:  Past Medical History: Anemia Depression - follows at the [facility] 5 children - 2 c-sections Hypertension (new)  Past Surgical History: Reviewed history from 12/14/2009 and no changes required. C-section x 2 BTL in 2008/02/08  Family History: Father died when she was a baby - unknown cause. Not close with brother.  Mother healthy  Social History: Reviewed history from 12/14/2009 and no changes required. Was in prison for 8 months in 1999-02-08. Has 5 children.  4 live with her mother, youngest lives with her.  Quit smoking 2 years ago but then restarted 7 months ago  Review of Systems       As stated in the HPI and negative for all other systems.   Vital Signs:  Patient profile:    40 year old female Height:      64.5 inches Weight:      286 pounds BMI:     48.51 Pulse rate:   100 / minute Resp:     16 per minute BP sitting:   168 / 102  (right arm)  Vitals Entered By: Marrion Coy, CNA (December 15, 2009 2:57 PM)  Physical Exam  General:  Well developed, well nourished, in no acute distress. Head:  normocephalic and atraumatic Eyes:  PERRLA/EOM intact; conjunctiva and lids normal. Mouth:  Teeth, gums and palate normal. Oral mucosa normal. Neck:  Neck supple, no JVD. No masses, thyromegaly or abnormal cervical nodes. Chest Wall:  no deformities or breast masses noted Lungs:  Clear bilaterally to auscultation and percussion. Abdomen:  Bowel sounds positive; abdomen soft and non-tender without masses, organomegaly, or hernias noted. No hepatosplenomegaly, morbidly obese Msk:  Back normal, normal gait. Muscle strength and tone normal. Extremities:  No clubbing or cyanosis. Neurologic:  Alert and oriented x 3. Skin:  Intact without lesions or rashes. Cervical Nodes:  no significant adenopathy Axillary Nodes:  no significant adenopathy Inguinal Nodes:  no significant adenopathy Psych:  Normal affect.   Detailed Cardiovascular Exam  Neck    Carotids: Carotids full and equal bilaterally without bruits.      Neck Veins: Normal, no JVD.    Heart    Inspection: no deformities or lifts noted.      Palpation: normal PMI with no thrills palpable.      Auscultation: regular rate  and rhythm, S1, S2 without murmurs, rubs, gallops, or clicks.    Vascular    Abdominal Aorta: no palpable masses, pulsations, or audible bruits.      Femoral Pulses: normal femoral pulses bilaterally.      Pedal Pulses: normal pedal pulses bilaterally.      Radial Pulses: normal radial pulses bilaterally.      Peripheral Circulation: no clubbing, cyanosis, or edema noted with normal capillary refill.     Impression & Recommendations:  Problem # 1:  SHORTNESS OF BREATH (ICD-786.05) I  suspect that her shortness of breath is multifactorial. I will start with a BNP level and echocardiogram. If these are normal I might not suggest further cardiac workup. Other issues need to be addressed as below. Orders: Echocardiogram (Echo) EKG w/ Interpretation (93000) Sleep Disorder Referral (Sleep Disorder)  Problem # 2:  OBESITY, UNSPECIFIED (ICD-278.00) I had a long discussion with this patient about the need for weight loss and the dangers of her morbid obesity. I gave her very specific instructions on diet and exercise she can begin now even as we are in the midst of this workup.  Problem # 3:  SLEEP APNEA (ICD-780.57) Sheet almost definitely has sleep apnea and I will order a sleep study. Orders: Sleep Disorder Referral (Sleep Disorder)  Problem # 4:  CHEST PAIN (ICD-786.50) This is atypical. I will consider stress testing in the future. Orders: Echocardiogram (Echo) EKG w/ Interpretation (93000)  Problem # 5:  ESSENTIAL HYPERTENSION, BENIGN (ICD-401.1) Her blood pressure is elevated today but he wasn't yesterday. We had to take a wrist measurement. This may be related to sleep apnea or a spurious reading. We will need to follow this closely and determine the most accurate means of measurement. She needs weight loss as above.  Other Orders: TLB-BNP (B-Natriuretic Peptide) (83880-BNPR)  Patient Instructions: 1)  Your physician recommends that you schedule a follow-up appointment in: 2 months with Dr Antoine Poche 2)  Your physician recommends that you have lab work today:  BNP 786.05 3)  Your physician recommends that you continue on your current medications as directed. Please refer to the Current Medication list given to you today. 4)  Your physician has requested that you have an echocardiogram.  Echocardiography is a painless test that uses sound waves to create images of your heart. It provides your doctor with information about the size and shape of your heart and how well  your heart's chambers and valves are working.  This procedure takes approximately one hour. There are no restrictions for this procedure. 5)  Your physician has recommended that you have a sleep study.  This test records several body functions during sleep, including:  brain activity, eye movement, oxygen and carbon dioxide blood levels, heart rate and rhythm, breathing rate and rhythm, the flow of air through your mouth and nose, snoring, body muscle movements, and chest and belly movement.  Appended Document: np6/dx:CP/dypsnea on excertion/bilateral leg edema/lg NL.  Awaiting echo results.

## 2010-10-18 NOTE — Assessment & Plan Note (Signed)
Summary: Np,tcb  ROI FAXED TO [facility] medical records: [phone].Arlyss Repress CMA,  December 14, 2009 10:11 AM   Vital Signs:  Patient profile:   40 year old female Height:      64.5 inches Weight:      286 pounds BMI:     48.51 Pulse rate:   74 / minute BP sitting:   134 / 70  Vitals Entered By: Arlyss Repress CMA, (December 14, 2009 9:08 AM) CC: new pt. c/o swelling of ankle and feet x 1 week. head aches x 4 days..sharp pain across forehead. denies light sensitivity, nausea. last pap 07-2009. last Tdap 1999-01-31. Is Patient Diabetic? No Pain Assessment Patient in pain? no        CC:  new pt. c/o swelling of ankle and feet x 1 week. head aches x 4 days..sharp pain across forehead. denies light sensitivity and nausea. last pap 07-2009. last Tdap Jan 31, 1999.Marland Kitchen  History of Present Illness: Ms. Slotnick comes in today as a new patient to our practice.  [facility]  She is here to establish care.  SHe complains of lower extremity swelling and headaches. 1) LE swelling - started about 2 weeks ago.  Worse in the evenings.  Sometimes better in the morning, sometimes not.  Is pitting -  leaves large sock line.  Over same time frame has been having dyspnea with walking up stairs.  Patient states this is new.  DId not used to get winded walking up one flight of stairs.  Does have occassional chest pain with it, but describes it as sharp and shooting, lasting only seconds.  No known history of heart disease but of note her father died when she was a baby and she does not know what of.  She is not close to her brother and so does not know his health.  Her mother is healthy.  SHe is obese.  No history of hypertension or diabetes.  Does smoke.  Used to smoke for years, quit for 2 years, and restarted 7 months ago.   2) Headahce - has history of occassional headaches.  However, for last week had a headache for a week.  Would take ibuprofen 800 and it would go away but then come  back.  Was like her "normal headache" except that it kept coming back.  Was taking ibuprofen about 3 times a day.  Not associated with nausea, vomitting, phonophobia, or photophobia.  No visual changes, numbness, weakness, or tingling.  Headache is gone today.  3) Preventive health - last pap in 11/10 at [facility].  Normal per her report.  History of 1 abnormal pap smear about 10 years ago, but denies having a colposcopy done.  Never had mammogram (only 6 yo).  Grandmother diagnosed with breast cancer in her 37s.  No other history of breast cancer.   Habits & Providers  Alcohol-Tobacco-Diet     Tobacco Status: current     Tobacco Counseling: to quit use of tobacco products     Cigarette Packs/Day: 0.5  Past History:  Past Medical History: Anemia Depression - follows at the [facility] 5 children - 2 c-sections  Past Surgical History: C-section x 2 BTL in 01/31/2008  Family History: Father died when she was a baby - unknown cause. Not close with brother  Mother healthy  Social History: Was in prison for 8 months in 1999/01/31. Has 5 children.  4 live with her mother, youngest lives with her.  Quit smoking 2 years ago but then restarted 7 months agoSmoking Status:  current Packs/Day:  0.5  Physical Exam  General:  obese, alert, NAD vitals reviewed Eyes:  pupils equal, pupils round, and pupils reactive to light.   Ears:  R ear normal and L ear normal.   Nose:  External nasal examination shows no deformity or inflammation. Nasal mucosa are pink and moist without lesions or exudates. Mouth:  Oral mucosa and oropharynx without lesions or exudates.  Teeth in good repair. Lungs:  Normal respiratory effort, chest expands symmetrically. Lungs are clear to auscultation, no crackles or wheezes. Heart:  Normal rate and regular rhythm. S1 and S2 normal without gallop, murmur, click, rub or other extra sounds. Pulses:  2+ radial  Extremities:  trace edema of lower extremities Neurologic:  alert &  oriented X3, cranial nerves II-XII intact, strength normal in all extremities, and gait normal.   Psych:  Oriented X3, memory intact for recent and remote, normally interactive, good eye contact, not anxious appearing, and not depressed appearing.     Impression & Recommendations:  Problem # 1:  LEG EDEMA, BILATERAL (ICD-782.3) Assessment New Sounds relatively benign, though not always resolved in the AM.  However, given new dyspnea on exertion occurring with it, feel wise to refer to cards for possible echo.  EKG normal here today.   Orders: 12 Lead EKG (12 Lead EKG) Cardiology Referral (Cardiology)  Problem # 2:  DYSPNEA ON EXERTION (ICD-786.09) Assessment: New See above.  Normal lung exam.  Certainly could be due to obesity/deconditioning but she states it is new in the last two weeks, which corresponds to time frame of leg swelling.  Orders: 12 Lead EKG (12 Lead EKG) Cardiology Referral (Cardiology)  Problem # 3:  CHEST PAIN (ICD-786.50) Assessment: New  Shooting and lasts only seconds = atypical.  EKG normal.  Cards referral anyway for the leg swelling and dyspnea on exertion.   Problem # 4:  HEADACHE (ICD-784.0) Assessment: New Resolved now.  Discussed limiting OTC analgesic use as can lead ot rebound headaches.  Will return if daily headache recurs.   Complete Medication List: 1)  Citalopram Hydrobromide 20 Mg Tabs (Citalopram hydrobromide) .Marland Kitchen.. 1 tab by mouth by mouth daily for depression  Other Orders: Tdap => 76yrs IM (04540) Admin 1st Vaccine (98119) Admin 1st Vaccine [facility]) [phone])  Patient Instructions: 1)  It was nice to meet you today.   2)  We will call you when we have your cardiology referral set-up. 3)  When you get a headache, try to limit your ibuprofen use to 3x/week.  If you get a bad one that won't go away or you start getting them daily again, please come back in.  If you develop vision changes, weakness, numbness, or tingling with your headache, go  to the ER. 4)  Please return in 2-3 months for a recheck of your headaches, your blood pressure, and to review your old records and obtain a TB test.  Please schedule the appointment on a Monday, Tuesday, or WEdnesday so we can do the TB test.   Prescriptions: CITALOPRAM HYDROBROMIDE 20 MG TABS (CITALOPRAM HYDROBROMIDE) 1 tab by mouth by mouth daily for depression  #30 x 5   Entered and Authorized by:   Ardeen Garland  MD   Signed by:   Ardeen Garland  MD on 12/14/2009   Method used:   Handwritten   RxID:   [id number]    Tetanus/Td Vaccine    Vaccine Type: Tdap  Site: left deltoid    Mfr: [email or web address]    Dose: 0.5 ml    Route: IM    Given by: San Morelle, SMA    Exp. Date: 12/08/2011    Lot #: [id number]    VIS given: 08/04/07 version given December 14, 2009.

## 2010-10-31 ENCOUNTER — Encounter: Payer: Self-pay | Admitting: Pulmonary Disease

## 2010-11-01 ENCOUNTER — Encounter: Payer: Self-pay | Admitting: *Deleted

## 2010-11-13 NOTE — Letter (Signed)
Summary: CMN for DME / [facility]  CMN for DME / [facility]   Imported By: Lennie Odor 11/08/2010 15:30:21  _____________________________________________________________________  External Attachment:    Type:   Image     Comment:   External Document

## 2010-12-21 LAB — POCT URINALYSIS DIP (DEVICE)
Bilirubin Urine: NEGATIVE
Hgb urine dipstick: NEGATIVE
Nitrite: NEGATIVE
Protein, ur: NEGATIVE mg/dL
Urobilinogen, UA: 1 mg/dL (ref 0.0–1.0)
pH: 6.5 (ref 5.0–8.0)

## 2010-12-25 LAB — POCT URINALYSIS DIP (DEVICE)
Glucose, UA: NEGATIVE mg/dL
Nitrite: POSITIVE — AB
Protein, ur: 100 mg/dL — AB
Specific Gravity, Urine: 1.025 (ref 1.005–1.030)
Urobilinogen, UA: 2 mg/dL — ABNORMAL HIGH (ref 0.0–1.0)
pH: 6 (ref 5.0–8.0)

## 2011-01-29 NOTE — Op Note (Signed)
Tonya Vaughn, Tonya Vaughn               ACCOUNT NO.:  [id number]   MEDICAL RECORD NO.:  [id number]          PATIENT TYPE:  INP   LOCATION:                                FACILITY:  [facility]   PHYSICIAN:  Tanya S. Shawnie Pons, M.D.   DATE OF BIRTH:  07-27-1971   DATE OF PROCEDURE:  04/06/2008  DATE OF DISCHARGE:  04/07/2008                               OPERATIVE REPORT   PREOPERATIVE DIAGNOSIS:  Multiparity, undesired fertility.   POSTOPERATIVE DIAGNOSIS:  Multiparity, undesired fertility.   PROCEDURE:  Postpartum BTL with Filshie clips.   SURGEON:  Shelbie Proctor. Shawnie Pons, MD.   ASSISTANT:  Karlton Lemon, MD.   ANESTHESIA:  Epidural plus local.   FINDINGS:  Normal tubes.   ESTIMATED BLOOD LOSS:  Less than 25 mL.   COMPLICATIONS:  None.   SPECIMEN:  Not applicable.   REASON FOR PROCEDURE:  Briefly, the patient is a 40 year old gravida 6,  para 5-1-0-5 who has history of 2 prior C-sections status post VBAC on  the day of surgery #3 who desires permanent sterilization.  She was  counseled regarding risks and benefits of the procedure including  permanency of sterilization, risk of failure 1 in 100, and increased  risk of ectopic.  The patient understands these risks and agrees to  proceed.   PROCEDURE:  The patient was taken to the OR.  She was placed in the  supine position.  She was prepped and draped in the usual sterile  fashion.  Lidocaine 10 mL was injected infraumbilically and a 1.5 cm  incision was made infraumbilically.  This was carried down through  underlying fascia with a knife.  The peritoneal cavity was entered  sharply.  The patient has been placed in Trendelenburg and airplaned to  the right.  The left tube after inserting a lap pad to clear away  omentum was found, followed through its fimbriated end and a Filshie  clip was placed across it under direct visualization.  Attention was  then turned to the patient's right tube.  She was airplaned to the left,  and the tube was  finely grasped with Babcock clamp and followed through  the fimbriated ends.  Filshie clip was placed  across this tube as well.  The tubes were returned to the abdomen.  The  fascia was grasped with Allis clamp, closed with 0 Vicryl suture in a  running fashion.  Subcutaneous suture of 3-0 was used to close the skin.  All instrument and lap counts were correct x2.  The patient was awakened  and taken to recovery room in stable condition.      Shelbie Proctor. Shawnie Pons, M.D.  Electronically Signed     TSP/MEDQ  D:  04/06/2008  T:  04/07/2008  Job:  [id number]

## 2011-02-01 NOTE — Op Note (Signed)
NAME:  Tonya Vaughn, Tonya Vaughn                         ACCOUNT NO.:  [id number]   MEDICAL RECORD NO.:  [id number]                   PATIENT TYPE:  INP   LOCATION:  9109                                 FACILITY:  [facility]   PHYSICIAN:  Phil D. Okey Dupre, M.D.                  DATE OF BIRTH:  1970/12/26   DATE OF PROCEDURE:  DATE OF DISCHARGE:                                 OPERATIVE REPORT   PREOPERATIVE DIAGNOSES:  1. Repeat cesarean section.  2. Patient in labor.   POSTOPERATIVE DIAGNOSES:  1. Repeat cesarean section.  2. Patient in labor.  3. Breech presentation, double footling.  4. Heavy meconium staining.   PROCEDURE:  Low transverse cesarean section.   SURGEON:  Javier Glazier. Okey Dupre, M.D.   OPERATIVE FINDINGS:  Small 5 pound 15 ounce female infant with an Apgars of  4, 7 and 8, meconium staining with thick meconium with a double footling  breech presentation.   DESCRIPTION OF PROCEDURE:  Under satisfactory spinal anesthesia with the  patient in the supine position.  The abdomen was prepped and draped in the  usual sterile manner and entered through a Pfannenstiel incision situated  just inside of the abdominal apron, 3 cm above the symphysis pubis and  extending for a total length of 16 cm.  The abdomen was entered by layers.  On entering the peritoneal cavity, there were heavy omental adhesions over  the whole lower uterine segment which were clamped, divided and tied with 0  plain catgut suture until we could get a clean area in the lower segment.  The visceral peritoneum and anterior surface of the uterus was opened  transversely and the uterus entered by sharp and blunt dissection.  The  membranes protruded through the opening.  It was obvious it was heavy  meconium and the DeLee suction was set up.  It was a double footling breech.  The membranes were then ruptured and the baby delivered by double footling  breech by breech extraction.  DeLee suction was used to suck the baby out.  Cord was doubly clamped and divided and baby handed to the pediatrician.  Findings are as above.  The placenta was manually removed, and the uterus  explored.  The uterus was closed with a continuous running locked 0 Vicryl  on an atraumatic needle.  Areas were observed for bleeding.  None was noted.  The rectus muscle was plicated in the middle.  This was via an enormous of  omentum and this was done with interrupted 0 Vicryl on an atraumatic needle.  Some surface muscle bleeders were controlled with hot cautery and the fascia  was then closed from either incision meeting in the midpoint with continuous  alternating locked 0 Vicryl on an atraumatic needle.  Subcutaneous bleeders  were controlled with hot cautery, and skin staples were used for skin edge  approximation.  Pressure dressing was applied.  Total blood loss was about  700 mL during the procedure.  The Foley catheter was draining clear amber  urine at the end of the procedure.  The patient was transferred to the  recovery room in satisfactory condition.  The placenta was sent for  pathological diagnosis.  Once again, the baby was a female weighing 5 pounds  15 ounces with 4, 7 and 8 Apgars and markedly meconium stained and a cord pH  of 7.36.                                               Phil D. Okey Dupre, M.D.    PDR/MEDQ  D:  11/28/2002  T:  11/29/2002  Job:  [id number]

## 2011-02-01 NOTE — Discharge Summary (Signed)
NAMEJORDAIN, RADIN                           ACCOUNT NO.:  [id number]   MEDICAL RECORD NO.:  [id number]                   PATIENT TYPE:   LOCATION:                                       FACILITY:   PHYSICIAN:  Nilda Simmer, M.D.                  DATE OF BIRTH:   DATE OF ADMISSION:  DATE OF DISCHARGE:                                 DISCHARGE SUMMARY   DATE OF BIRTH:  [birth year].   PROCEDURE:  Gallbladder ultrasound.   DISCHARGE DIAGNOSES:  1. Pyelonephritis.  2. Abnormal liver function studies.  3. Nausea and vomiting.  4. Intrauterine pregnancy at 72 and 5/7th weeks gestation.  5. History of uterine fibroids.   DISCHARGE MEDICATIONS:  1. Augmentin 875/125 one by mouth twice a day times three days.  2. Phenergan 25 mg one by mouth every six hours as needed nausea.  3. Prenatal vitamins each day.   FOLLOW UP:  1. The patient is to [facility], 2002. She is to call for an appointment.  2. The patient is to contact the [facility] to schedule an ultrasound     to assess uterine fibroids.   HISTORY OF PRESENT ILLNESS:  Ms. Templeman is a 40 year old Gravida III, Para I,  1/0/1, presenting originally at 76 and 1/7th weeks gestation secondary to  lower back pain and dysuria for approximately one week. Upon evaluation, the  patient was found to have an abnormal urine on physical examination  consistent with pyelonephritis.   HOSPITAL COURSE:  The patient was admitted for IV antibiotics and  rehydration. The patient was found to be moderately dehydrated and was  rehydrated with IV fluids. Due to elevated blood pressures during  hospitalization, PIH labs were performed during hospitalization. The patient  was found to have abnormal liver function studies as well as abnormal total  bilirubin. Hepatitis panel was negative during hospitalization. Gallbladder  ultrasound did reveal gallbladder sludge, however, no evidence of  infection  or stones. Amylase and lipase were within normal limits during  hospitalization as well. Liver function studies normalized during  hospitalization. However, the patient did have elevated bilirubin. The  patient declined urine drug screen during hospitalization as well.  Persistent nausea and vomiting were a significant issue several days prior  to discharge. On hospital day five, the patient was tolerating by mouth well  without any difficulty and was discharged to home. The patient will follow-  up in [facility] for follow-up of liver function studies and  evaluation of symptoms.   DISCHARGE LAB STUDIES:  Hemoglobin and hematocrit 10.4 and 31.0. Platelets  294,000. WBC count 4.9. Sodium 137, potassium 4.0, chloride 107, CO2 23,  glucose 95, BUN 3, creatinine 0.6, calcium 8.1, total protein 5.8, albumin  2.5, AST 22, ALT 63, alkaline phosphatase 92, and total bilirubin  1.5.  Lipase 19, amylase 97, uric acid 2.7, LDH 111. Acute hepatitis panel was  negative. A 24 hour urine with total volume of 1,275 cc. Creatinine  clearance 199, total protein 421 mg. Wet prep revealed no trich. No yeast.  Few clue cells.   WOUND CARE:  Not applicable.   DIET:  No restrictions.   ACTIVITY:  No restrictions.   SEXUAL ACTIVITY:  No restrictions.   PRETERM LABOR PRECAUTIONS:  These were explained in detail during  hospitalization.                                                Nilda Simmer, M.D.    KS/MEDQ  D:  09/09/2002  T:  09/10/2002  Job:  [id number]   cc:   Shelbie Proctor. Shawnie Pons, M.D.  [address]  [place], [place] [address]  Fax: [phone]
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
## 2011-02-13 ENCOUNTER — Ambulatory Visit: Payer: Self-pay | Admitting: Family Medicine

## 2011-05-02 ENCOUNTER — Ambulatory Visit (INDEPENDENT_AMBULATORY_CARE_PROVIDER_SITE_OTHER): Payer: Medicaid Other | Admitting: Family Medicine

## 2011-05-02 ENCOUNTER — Other Ambulatory Visit (HOSPITAL_COMMUNITY)
Admission: RE | Admit: 2011-05-02 | Discharge: 2011-05-02 | Disposition: A | Discharge status: Home / Self Care | Payer: Medicaid Other | Source: Ambulatory Visit | Attending: Family Medicine | Admitting: Family Medicine

## 2011-05-02 ENCOUNTER — Encounter: Payer: Self-pay | Admitting: Family Medicine

## 2011-05-02 DIAGNOSIS — Z20828 Contact with and (suspected) exposure to other viral communicable diseases: Secondary | ICD-10-CM

## 2011-05-02 DIAGNOSIS — K219 Gastro-esophageal reflux disease without esophagitis: Secondary | ICD-10-CM

## 2011-05-02 DIAGNOSIS — M25569 Pain in unspecified knee: Secondary | ICD-10-CM

## 2011-05-02 DIAGNOSIS — F17200 Nicotine dependence, unspecified, uncomplicated: Secondary | ICD-10-CM

## 2011-05-02 DIAGNOSIS — F329 Major depressive disorder, single episode, unspecified: Secondary | ICD-10-CM

## 2011-05-02 DIAGNOSIS — N72 Inflammatory disease of cervix uteri: Secondary | ICD-10-CM

## 2011-05-02 DIAGNOSIS — E669 Obesity, unspecified: Secondary | ICD-10-CM

## 2011-05-02 DIAGNOSIS — Z01419 Encounter for gynecological examination (general) (routine) without abnormal findings: Secondary | ICD-10-CM | POA: Insufficient documentation

## 2011-05-02 DIAGNOSIS — Z124 Encounter for screening for malignant neoplasm of cervix: Secondary | ICD-10-CM

## 2011-05-02 DIAGNOSIS — Z202 Contact with and (suspected) exposure to infections with a predominantly sexual mode of transmission: Secondary | ICD-10-CM

## 2011-05-02 MED ORDER — TRAZODONE HCL 50 MG PO TABS: 25.0000 mg | ORAL_TABLET | Freq: Every day | ORAL | Status: DC

## 2011-05-02 MED ORDER — SERTRALINE HCL 25 MG PO TABS: 25.0000 mg | ORAL_TABLET | Freq: Every day | ORAL | Status: DC

## 2011-05-02 NOTE — Patient Instructions (Signed)
It was nice to see you! I'm sorry your mood has been so bad, we will start a new medicine for you to see if this helps with your irritability.  In addition, I will send in a medicine to help you sleep.  We are drawing some blood work today to check some basic labs as well as to test for STDs.  We also did a PAP smear today.  I will send you a letter with these results in the next 2-4 weeks.  For your knee, try icing it for 15 minutes 2-3 times per day for the next 5 days.  In addition, take ibuprofen 600mg  every 8 hours for the next 3-5 days to help with the inflammation in your knee.  This should help make it start to feel better.  Make sure you are wearing tennis shoes with good support to help your knee as well.  Remember, weight loss is going to be the best thing to help with your knee pain!  When you decide you are ready to quit smoking, PLEASE let us know and we can help you!  It's very important for your health to stop smoking!

## 2011-05-03 LAB — COMPREHENSIVE METABOLIC PANEL
ALT: <8 U/L (ref 0–35)
BUN: 17 mg/dL (ref 6–23)
CO2: 22 mEq/L (ref 19–32)
Calcium: 9.1 mg/dL (ref 8.4–10.5)
Chloride: 108 mEq/L (ref 96–112)
Creat: 1.15 mg/dL — ABNORMAL HIGH (ref 0.50–1.10)
Glucose, Bld: 76 mg/dL (ref 70–99)
Total Bilirubin: 0.4 mg/dL (ref 0.3–1.2)

## 2011-05-03 LAB — RPR

## 2011-05-03 LAB — CBC
HCT: 41 % (ref 36.0–46.0)
MCV: 92.6 fL (ref 78.0–100.0)
RBC: 4.43 MIL/uL (ref 3.87–5.11)
RDW: 15.1 % (ref 11.5–15.5)
WBC: 6.1 10*3/uL (ref 4.0–10.5)

## 2011-05-03 LAB — HIV ANTIBODY (ROUTINE TESTING W REFLEX): HIV: NONREACTIVE

## 2011-05-05 ENCOUNTER — Encounter: Payer: Self-pay | Admitting: Family Medicine

## 2011-05-05 DIAGNOSIS — Z01419 Encounter for gynecological examination (general) (routine) without abnormal findings: Secondary | ICD-10-CM | POA: Insufficient documentation

## 2011-05-05 NOTE — Assessment & Plan Note (Signed)
Pt continues to smoke ~ 1/2 ppd.  Not interested in discussing smoking cessation.  Encouraged pt to begin thinking about it and informed her that we are here if she needs assistance.

## 2011-05-05 NOTE — Assessment & Plan Note (Addendum)
Pt has not been on celexa x8 months; felt it maybe helped a little but is not particularly loyal to this medication.  +irritability and crying, decreases sleep, decreased appetite.  Will start Zoloft for depression and trazodone for sleep. No SI/HI.  Given red flags to return for. F/u 4-6 weeks.

## 2011-05-05 NOTE — Assessment & Plan Note (Signed)
PAP done today. GC/Chlam, HIV, RPR done per pt request. LDL checked 2/2 pt being obese.  CBC, CMET checked.

## 2011-05-05 NOTE — Assessment & Plan Note (Signed)
BMI 38.  Likely contributing factor to knee pain.  Encouraged weight loss and extended assistance to pt if she wants.

## 2011-05-05 NOTE — Progress Notes (Signed)
S: Pt comes in today for well woman exam.  HEALTH MAINTENANCE/OBESITY Pt here for routine PAP, would like GC/Chlam and HIV/RPR since she states she likes to have them done routinely. 1 female partner for >15 years.  Continues to smoke ~ 1/2 ppd; not ready to quit (precontemplative stage).  No alcohol. Occasional THC use. No exercise or diet.  KNEE PAIN Left > right.  Occasional swelling and pain, mostly achy.  Occasional difficulty bending it.  No warmth or erythema. No fevers/chills.  Has tried motrin but that is all.  Has not tried ice or stretches.  Pt wearing flat flip flops today but states usually wears tennis shoes when standing at work.   DEPRESSION Pt with h/o PTSD, was on celexa but has not been taking in ~8 months.  Interested in restarting a medication today.  No SI/HI.  Does endorse frequent crying spells, significantly increased irritability, decreased ability to sleep despite increased fatigue and decreased appetite.  No pressured thinking or pressure speech.  No manic symptoms.       ROS: Per HPI  History  Smoking status  . Current Everyday Smoker -- 0.5 packs/day  . Types: Cigarettes  Smokeless tobacco  . Not on file  Comment: pt states 'i don't smoke that much'    O:  Filed Vitals:   05/02/11 1419  BP: 130/80  Pulse: 80    Gen: NAD CV: RRR, no murmur Pulm: CTA bilat, no wheezes or crackles Abd: soft, NT Ext: Warm, no chronic skin changes, no edema Pelvic exam: normal external genitalia, vulva, vagina, cervix, uterus and adnexa, CERVIX: normal appearing cervix without discharge or lesions, no discharge noted, ADNEXA: normal adnexa in size, nontender and no masses, no masses.    A/P: 40 y.o. female here for well woman exam -See problem list -f/u in 4-6 weeks

## 2011-05-05 NOTE — Assessment & Plan Note (Signed)
Left worse than right. Achy, painful with bending.  Suggested wearing tennis shoes with good support.  RICE. See pt instructions for details. No red flags for infection.

## 2011-05-07 ENCOUNTER — Encounter: Payer: Self-pay | Admitting: Family Medicine

## 2011-05-28 ENCOUNTER — Ambulatory Visit (INDEPENDENT_AMBULATORY_CARE_PROVIDER_SITE_OTHER): Payer: Medicaid Other | Admitting: Family Medicine

## 2011-05-28 ENCOUNTER — Encounter: Payer: Self-pay | Admitting: Family Medicine

## 2011-05-28 DIAGNOSIS — Z23 Encounter for immunization: Secondary | ICD-10-CM

## 2011-05-28 DIAGNOSIS — Z0000 Encounter for general adult medical examination without abnormal findings: Secondary | ICD-10-CM

## 2011-05-28 DIAGNOSIS — M25569 Pain in unspecified knee: Secondary | ICD-10-CM

## 2011-05-28 DIAGNOSIS — Z01419 Encounter for gynecological examination (general) (routine) without abnormal findings: Secondary | ICD-10-CM

## 2011-05-28 DIAGNOSIS — F329 Major depressive disorder, single episode, unspecified: Secondary | ICD-10-CM

## 2011-05-28 DIAGNOSIS — K219 Gastro-esophageal reflux disease without esophagitis: Secondary | ICD-10-CM

## 2011-05-28 MED ORDER — DICLOFENAC SODIUM 1 % TD GEL: 1.0000 "application " | Freq: Four times a day (QID) | TRANSDERMAL | Status: DC

## 2011-05-28 MED ORDER — TRAZODONE HCL 50 MG PO TABS: 50.0000 mg | ORAL_TABLET | Freq: Every day | ORAL | Status: DC

## 2011-05-28 MED ORDER — OMEPRAZOLE 20 MG PO CPDR: 20.0000 mg | DELAYED_RELEASE_CAPSULE | Freq: Every day | ORAL | Status: DC

## 2011-05-28 MED ORDER — IBUPROFEN 800 MG PO TABS: 800.0000 mg | ORAL_TABLET | Freq: Three times a day (TID) | ORAL | Status: AC

## 2011-05-28 MED ORDER — SERTRALINE HCL 100 MG PO TABS: 150.0000 mg | ORAL_TABLET | Freq: Every day | ORAL | Status: DC

## 2011-05-28 NOTE — Assessment & Plan Note (Signed)
Feels like meds are maybe helping.  Still having irritability and crying, but has only been on meds x3 weeks (and only on 100mg  x1-2 weeks).  Will increased to 150mg  qd.  No SI/HI. Re-emphasized that full effect won't be seen until 4-6 weeks.  Pt agrees to continue. No side effects from medications.  Is getting therapy at [facility] for PTSD.  Encouraged pt to continue with this.  Still needing trazodone to help with sleep- using 1 tab nightly but is sleeping much better.

## 2011-05-28 NOTE — Assessment & Plan Note (Signed)
L>R, achy, some swelling in back of knee at the end of the day.  Has not tried RICE. No red flags for infection, no erythema, edema; full ROM.  Will give Rx for motrin 800mg  x 5-7 days and voltaren gel up to QID.

## 2011-05-28 NOTE — Patient Instructions (Addendum)
It was good to see you again! For your knee, I will give you a prescription for motrin and voltaren gel to help with the pain.  Take the motrin 3 times per day for the next 5-7 days. Also try to ice your knee for 15 minutes 3 times per day for the next 5 days.  If you can't ice it, at the very least please ELEVATE it. We are going to increase your Zoloft to 150mg  per day.  I'll send in a new script but you can use up the rest of what you have left first (take 6 tablets once per day). You got your flu shot today! Please come back and see me in 4 weeks so we can check your mood.  Keep seeing your therapist! I think that will be something very beneficial for you!

## 2011-05-28 NOTE — Assessment & Plan Note (Signed)
FLU SHOT GIVEN

## 2011-05-28 NOTE — Assessment & Plan Note (Signed)
prilosec - refilled.

## 2011-05-28 NOTE — Progress Notes (Signed)
S: Pt comes in today for follow up.  KNEE PAIN Continues to have pain in left>right knee.  Did not try motrin or icing.  Has not really been wearing tennis shoes.  Was not able to get motrin 2/2 no money, is amenable to trying it is Rx is covered by insurance.  Swelling in the the back of her knee, worse at the end of the day, better in the morning.  Pain/discomfort has not really changed.  No fevers/chills. No redness or warmth.   DEPRESSION Feels like medicine is maybe starting to help but can't name specific things that are better.  Still having crying spells and still irritable.  Taking medication every day, no side effects that she can tel.  Sleeping better w/ trazodone; taking 1 pill nightly.  No grogginess or hung-over feeling in the mornings.  Still with decreased energy and decreased appetite. No SI/HI. Had "a few" thoughts of wanting to hurt self after 1-2 weeks of medicine but they have subsided.  No plan to hurt self.  Is seeing [facility] for therapy for PTSD. Thinks it is going ok there.    ROS: Per HPI  History  Smoking status  . Current Everyday Smoker -- 0.5 packs/day  . Types: Cigarettes  Smokeless tobacco  . Not on file  Comment: pt states 'i don't smoke that much'    O:  Filed Vitals:   05/28/11 1322  BP: 126/91  Pulse: 60    Gen: NAD, flat affect, appropriate CV: RRR, no murmur Pulm: CTA bilat, no wheezes or crackles Ext: Warm, no chronic skin changes, no edema; LEFT KNEE: no erythema, swelling, warmth or tenderness, full ROM.    A/P: 40 y.o. female p/w depression, knee pain -See problem list -f/u in 4 weeks

## 2011-06-10 LAB — POCT URINALYSIS DIP (DEVICE)
Bilirubin Urine: NEGATIVE
Glucose, UA: NEGATIVE
Ketones, ur: NEGATIVE
Nitrite: NEGATIVE
Operator id: 148111

## 2011-06-11 LAB — POCT URINALYSIS DIP (DEVICE)
Bilirubin Urine: NEGATIVE
Bilirubin Urine: NEGATIVE
Glucose, UA: NEGATIVE
Glucose, UA: NEGATIVE
Ketones, ur: NEGATIVE
Ketones, ur: NEGATIVE
Nitrite: NEGATIVE
Nitrite: NEGATIVE
Operator id: 297281
Operator id: 297281
pH: 6.5
pH: 8

## 2011-06-12 LAB — POCT URINALYSIS DIP (DEVICE)
Bilirubin Urine: NEGATIVE
Bilirubin Urine: NEGATIVE
Nitrite: NEGATIVE
Nitrite: NEGATIVE
Operator id: 148111
Operator id: 297281
Protein, ur: NEGATIVE
pH: 7.5
pH: 7

## 2011-06-12 LAB — URINALYSIS, ROUTINE W REFLEX MICROSCOPIC
Bilirubin Urine: NEGATIVE
Ketones, ur: NEGATIVE
Nitrite: NEGATIVE
Specific Gravity, Urine: 1.015
Urobilinogen, UA: 2 — ABNORMAL HIGH

## 2011-06-13 LAB — POCT URINALYSIS DIP (DEVICE)
Bilirubin Urine: NEGATIVE
Glucose, UA: NEGATIVE
Glucose, UA: NEGATIVE
Glucose, UA: NEGATIVE
Glucose, UA: NEGATIVE
Hgb urine dipstick: NEGATIVE
Hgb urine dipstick: NEGATIVE
Hgb urine dipstick: NEGATIVE
Ketones, ur: NEGATIVE
Ketones, ur: NEGATIVE
Ketones, ur: NEGATIVE
Ketones, ur: NEGATIVE
Ketones, ur: NEGATIVE
Ketones, ur: NEGATIVE
Nitrite: NEGATIVE
Nitrite: POSITIVE — AB
Nitrite: NEGATIVE
Nitrite: NEGATIVE
Operator id: 159681
Specific Gravity, Urine: 1.015
pH: 7
pH: 8.5 — ABNORMAL HIGH
pH: 6.5
pH: 6.5

## 2011-06-14 LAB — POCT URINALYSIS DIP (DEVICE)
Glucose, UA: NEGATIVE
Nitrite: NEGATIVE
Operator id: 297281
pH: 7

## 2011-06-14 LAB — RAPID URINE DRUG SCREEN, HOSP PERFORMED
Amphetamines: NOT DETECTED
Barbiturates: NOT DETECTED
Cocaine: NOT DETECTED
Opiates: NOT DETECTED

## 2011-06-14 LAB — CBC
Hemoglobin: 10.6 — ABNORMAL LOW
RBC: 3.47 — ABNORMAL LOW
RDW: 13.6
WBC: 6.4

## 2011-06-17 LAB — URINALYSIS, ROUTINE W REFLEX MICROSCOPIC
Nitrite: NEGATIVE
Specific Gravity, Urine: 1.03
Urobilinogen, UA: 1

## 2011-06-17 LAB — POCT PREGNANCY, URINE: Preg Test, Ur: NEGATIVE

## 2011-06-19 ENCOUNTER — Other Ambulatory Visit: Payer: Self-pay | Admitting: Family Medicine

## 2011-06-19 DIAGNOSIS — F329 Major depressive disorder, single episode, unspecified: Secondary | ICD-10-CM

## 2011-06-24 LAB — POCT URINALYSIS DIP (DEVICE)
Hgb urine dipstick: NEGATIVE
Nitrite: NEGATIVE
Urobilinogen, UA: 0.2
pH: 6

## 2011-06-24 LAB — POCT PREGNANCY, URINE: Preg Test, Ur: POSITIVE

## 2011-07-23 ENCOUNTER — Ambulatory Visit: Payer: Medicaid Other | Admitting: Family Medicine

## 2011-07-29 ENCOUNTER — Ambulatory Visit: Payer: Medicaid Other | Admitting: Family Medicine

## 2011-08-06 ENCOUNTER — Ambulatory Visit: Payer: Medicaid Other | Admitting: Family Medicine

## 2011-09-24 ENCOUNTER — Encounter: Payer: Self-pay | Admitting: Family Medicine

## 2011-09-24 ENCOUNTER — Ambulatory Visit (INDEPENDENT_AMBULATORY_CARE_PROVIDER_SITE_OTHER): Payer: Medicaid Other | Admitting: Family Medicine

## 2011-09-24 DIAGNOSIS — K219 Gastro-esophageal reflux disease without esophagitis: Secondary | ICD-10-CM

## 2011-09-24 DIAGNOSIS — F329 Major depressive disorder, single episode, unspecified: Secondary | ICD-10-CM

## 2011-09-24 DIAGNOSIS — R05 Cough: Secondary | ICD-10-CM

## 2011-09-24 DIAGNOSIS — R058 Other specified cough: Secondary | ICD-10-CM | POA: Insufficient documentation

## 2011-09-24 MED ORDER — TRAZODONE HCL 50 MG PO TABS: 50.0000 mg | ORAL_TABLET | Freq: Every day | ORAL | Status: DC

## 2011-09-24 MED ORDER — SERTRALINE HCL 100 MG PO TABS
150.0000 mg | ORAL_TABLET | Freq: Every day | ORAL | Status: DC
Start: 2011-09-24 — End: 2013-02-24

## 2011-09-24 MED ORDER — BENZONATATE 100 MG PO CAPS: 100.0000 mg | ORAL_CAPSULE | Freq: Two times a day (BID) | ORAL | Status: AC | PRN

## 2011-09-24 MED ORDER — OMEPRAZOLE 20 MG PO CPDR: 20.0000 mg | DELAYED_RELEASE_CAPSULE | Freq: Every day | ORAL | Status: DC

## 2011-09-24 MED ORDER — GUAIFENESIN-CODEINE 100-10 MG/5ML PO SYRP: 5.0000 mL | ORAL_SOLUTION | Freq: Three times a day (TID) | ORAL | Status: AC | PRN

## 2011-09-24 NOTE — Patient Instructions (Signed)

## 2011-09-24 NOTE — Progress Notes (Signed)
  Subjective:   1. Cough x 1 week  Tonya Vaughn is a 41 y.o. female here for evaluation of a cough. Onset of symptoms was 1 week ago. Symptoms have been unchanged since that time. The cough is harsh and painful and is aggravated by cold air. Associated symptoms include: chest pain. Patient does not have a history of asthma. Patient does not have a history of environmental allergens. Patient has not traveled recently. Patient does have a history of smoking. Patient has not had a previous chest x-ray. Patient has not had a PPD done.  The following portions of the patient's history were reviewed and updated as appropriate: allergies, current medications, past family history, past medical history, past social history, past surgical history and problem list.  Review of Systems Pertinent items are noted in HPI.    Objective:   Filed Vitals:   09/24/11 1629  BP: 138/84  Temp: 98.6 F (37 C)  TempSrc: Oral  Height: 5' 4.6" (1.641 m)  Weight: 222 lb (100.699 kg)  Lungs:  Normal respiratory effort, chest expands symmetrically. Lungs are clear to auscultation, no crackles or wheezes. Throat: normal mucosa, no exudate, uvula midline, no redness Mouth - no lesions, mucous membranes are moist, no decaying teeth  Heart - Regular rate and rhythm.  No murmurs, gallops or rubs.    Neck:  No deformities, thyromegaly, masses, or tenderness noted.   Supple with full range of motion without pain.  Assessment:

## 2011-09-24 NOTE — Assessment & Plan Note (Signed)
Explained lack of efficacy of antibiotics in viral disease.  Antitussives per medication orders.  Avoid exposure to tobacco smoke and fumes.  Call if shortness of breath worsens, blood in sputum, change in character of cough, development of fever or chills, inability to maintain nutrition and hydration. Avoid exposure to tobacco smoke and fumes.

## 2011-09-25 MED ORDER — SERTRALINE HCL 100 MG PO TABS: 150.0000 mg | ORAL_TABLET | Freq: Every day | ORAL | Status: DC

## 2011-09-25 NOTE — Progress Notes (Signed)
Addended by: Edd Arbour on: 09/25/2011 08:55 AM   Modules accepted: Orders

## 2011-12-12 ENCOUNTER — Encounter (HOSPITAL_COMMUNITY): Payer: Self-pay | Admitting: *Deleted

## 2011-12-12 ENCOUNTER — Emergency Department (HOSPITAL_COMMUNITY)
Admission: EM | Admit: 2011-12-12 | Discharge: 2011-12-12 | Disposition: A | Discharge status: Home / Self Care | Payer: Medicaid Other | Attending: Emergency Medicine | Admitting: Emergency Medicine

## 2011-12-12 ENCOUNTER — Emergency Department (HOSPITAL_COMMUNITY): Payer: Medicaid Other

## 2011-12-12 DIAGNOSIS — R079 Chest pain, unspecified: Secondary | ICD-10-CM | POA: Insufficient documentation

## 2011-12-12 DIAGNOSIS — F17200 Nicotine dependence, unspecified, uncomplicated: Secondary | ICD-10-CM | POA: Insufficient documentation

## 2011-12-12 DIAGNOSIS — S20219A Contusion of unspecified front wall of thorax, initial encounter: Secondary | ICD-10-CM | POA: Insufficient documentation

## 2011-12-12 DIAGNOSIS — S01502A Unspecified open wound of oral cavity, initial encounter: Secondary | ICD-10-CM | POA: Insufficient documentation

## 2011-12-12 DIAGNOSIS — S01512A Laceration without foreign body of oral cavity, initial encounter: Secondary | ICD-10-CM

## 2011-12-12 MED ORDER — AMOXICILLIN 500 MG PO CAPS: 500.0000 mg | ORAL_CAPSULE | Freq: Three times a day (TID) | ORAL | Status: AC

## 2011-12-12 MED ORDER — IBUPROFEN 800 MG PO TABS
800.0000 mg | ORAL_TABLET | ORAL | Status: AC
  Administered 2011-12-12: 800 mg via ORAL
  Filled 2011-12-12: qty 1

## 2011-12-12 MED ORDER — IBUPROFEN 800 MG PO TABS: 800.0000 mg | ORAL_TABLET | Freq: Three times a day (TID) | ORAL | Status: AC | PRN

## 2011-12-12 NOTE — ED Notes (Signed)
Splotches of purple bruising on pt's upper left lateral rips
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
## 2011-12-12 NOTE — ED Notes (Signed)
Pt also c/o pain on left upper lip
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
## 2011-12-22 NOTE — ED Provider Notes (Signed)
Evaluation and management procedures were performed by the PA/NP/Resident Physician under my supervision/collaboration.   Evans Levee D Cheney Gosch, MD 12/22/11 1551 

## 2012-02-18 ENCOUNTER — Ambulatory Visit: Payer: Medicaid Other | Admitting: Family Medicine

## 2012-04-24 ENCOUNTER — Ambulatory Visit: Payer: Medicaid Other

## 2012-04-27 ENCOUNTER — Ambulatory Visit: Payer: Medicaid Other

## 2013-02-24 ENCOUNTER — Encounter (HOSPITAL_COMMUNITY): Payer: Self-pay | Admitting: *Deleted

## 2013-02-24 ENCOUNTER — Emergency Department (HOSPITAL_COMMUNITY)
Admission: EM | Admit: 2013-02-24 | Discharge: 2013-02-24 | Disposition: A | Discharge status: Home / Self Care | Payer: Self-pay | Attending: Emergency Medicine | Admitting: Emergency Medicine

## 2013-02-24 DIAGNOSIS — F17200 Nicotine dependence, unspecified, uncomplicated: Secondary | ICD-10-CM | POA: Insufficient documentation

## 2013-02-24 DIAGNOSIS — K0889 Other specified disorders of teeth and supporting structures: Secondary | ICD-10-CM

## 2013-02-24 DIAGNOSIS — J3489 Other specified disorders of nose and nasal sinuses: Secondary | ICD-10-CM | POA: Insufficient documentation

## 2013-02-24 DIAGNOSIS — R220 Localized swelling, mass and lump, head: Secondary | ICD-10-CM | POA: Insufficient documentation

## 2013-02-24 DIAGNOSIS — K029 Dental caries, unspecified: Secondary | ICD-10-CM | POA: Insufficient documentation

## 2013-02-24 DIAGNOSIS — K089 Disorder of teeth and supporting structures, unspecified: Secondary | ICD-10-CM | POA: Insufficient documentation

## 2013-02-24 MED ORDER — HYDROCODONE-ACETAMINOPHEN 5-325 MG PO TABS
2.0000 | ORAL_TABLET | Freq: Once | ORAL | Status: AC
  Administered 2013-02-24: 2 via ORAL
  Filled 2013-02-24: qty 2

## 2013-02-24 MED ORDER — PENICILLIN V POTASSIUM 500 MG PO TABS: 500.0000 mg | ORAL_TABLET | Freq: Four times a day (QID) | ORAL | Status: DC

## 2013-02-24 MED ORDER — HYDROCODONE-ACETAMINOPHEN 5-325 MG PO TABS: 2.0000 | ORAL_TABLET | ORAL | Status: DC | PRN

## 2013-02-24 NOTE — ED Provider Notes (Signed)
  Medical screening examination/treatment/procedure(s) were performed by non-physician practitioner and as supervising physician I was immediately available for consultation/collaboration.    Pericles Carmicheal, MD 02/24/13 2359 

## 2013-02-24 NOTE — ED Notes (Signed)
Hannah PA-C sts hold pt for 15 min and recheck BP. Pt made aware of plan. sts she needs to call to update family. Given phone.

## 2013-02-24 NOTE — ED Notes (Signed)
Importance of finding PCP to follow up with bloodpressure stressed to pt. Resource guide gone over. Pt agreeable.

## 2013-02-24 NOTE — ED Notes (Signed)
Pt is here with left lower dental pain and swelling 

## 2013-02-24 NOTE — ED Notes (Addendum)
Dahlia Client PA made aware of new BP reading sts re-check in 10 min. Pt made aware- appears frustrated- effects and risks of high blood pressure explained to patient. Pt agreeable to stay. Lights dimmed pt resting in eye chair.

## 2013-02-24 NOTE — ED Provider Notes (Signed)
History    This chart was scribed for non-physician practitioner, Dierdre Forth, PAC working with Gerhard Munch, MD by Donne Anon, ED Scribe. This patient was seen in room TR04C/TR04C and the patient's care was started at 1506.   CSN: [id number]  Arrival date & time 02/24/13  1343   First MD Initiated Contact with Patient 02/24/13 1506      Chief Complaint  Patient presents with  . Dental Pain     The history is provided by the patient and medical records. No language interpreter was used.   HPI Comments: Tonya Vaughn is a 42 y.o. female who presents to the Emergency Department complaining of 1 day of gradual onset, gradually worsening, constant left lower dental pain with associated swelling and nasal congestion. She has had similar pain before. She tried a generic brand of pain reliever with little relief. She denies ear pain, fever, nausea, vomiting, diarrhea or any other pain.She reports that she had several tooth extractions 5 years ago, but is unsure of the dentist she saw.  Pt is a current every day smoker.    History reviewed. No pertinent past medical history.  Past Surgical History  Procedure Laterality Date  . Tubal ligation      No family history on file.  History  Substance Use Topics  . Smoking status: Current Every Day Smoker -- 0.50 packs/day    Types: Cigarettes  . Smokeless tobacco: Not on file     Comment: pt states 'i don't smoke that much'  . Alcohol Use: Yes     Comment: occ     Review of Systems  Constitutional: Negative for fever, diaphoresis, appetite change, fatigue and unexpected weight change.  HENT: Positive for congestion and dental problem. Negative for mouth sores and neck stiffness.   Eyes: Negative for visual disturbance.  Respiratory: Negative for cough, chest tightness, shortness of breath and wheezing.   Cardiovascular: Negative for chest pain.  Gastrointestinal: Negative for nausea, vomiting, abdominal pain, diarrhea  and constipation.  Endocrine: Negative for polydipsia, polyphagia and polyuria.  Genitourinary: Negative for dysuria, urgency, frequency and hematuria.  Musculoskeletal: Negative for back pain.  Skin: Negative for rash.  Allergic/Immunologic: Negative for immunocompromised state.  Neurological: Negative for syncope, light-headedness and headaches.  Hematological: Does not bruise/bleed easily.  Psychiatric/Behavioral: Negative for sleep disturbance. The patient is not nervous/anxious.     Allergies  Review of patient's allergies indicates no known allergies.  Home Medications   Current Outpatient Rx  Name  Route  Sig  Dispense  Refill  . naproxen sodium (ANAPROX) 220 MG tablet   Oral   Take 440 mg by mouth daily as needed. For pain         . OVER THE COUNTER MEDICATION   Oral   Take 1 tablet by mouth every 4 (four) hours as needed. OTC pain reliever for pain         . HYDROcodone-acetaminophen (NORCO/VICODIN) 5-325 MG per tablet   Oral   Take 2 tablets by mouth every 4 (four) hours as needed for pain.   6 tablet   0   . penicillin v potassium (VEETID) 500 MG tablet   Oral   Take 1 tablet (500 mg total) by mouth 4 (four) times daily.   40 tablet   0     BP 161/92  Pulse 76  Temp(Src) 98.3 F (36.8 C) (Oral)  Resp 18  SpO2 97%  Physical Exam  Nursing note and vitals reviewed. Constitutional: She  is oriented to person, place, and time. She appears well-developed and well-nourished.  HENT:  Head: Normocephalic.  Right Ear: Tympanic membrane, external ear and ear canal normal.  Left Ear: Tympanic membrane, external ear and ear canal normal.  Nose: Nose normal. Right sinus exhibits no maxillary sinus tenderness and no frontal sinus tenderness. Left sinus exhibits no maxillary sinus tenderness and no frontal sinus tenderness.  Mouth/Throat: Uvula is midline, oropharynx is clear and moist and mucous membranes are normal. No oral lesions. Abnormal dentition. Dental  caries present. No edematous or lacerations. No oropharyngeal exudate, posterior oropharyngeal edema, posterior oropharyngeal erythema or tonsillar abscesses.  Pain to tooth # 20 with dental carries throughout her mouth. No broken teeth. No area of erythema, induration or fluctuance. no gross abscess   Eyes: Conjunctivae are normal. Pupils are equal, round, and reactive to light. Right eye exhibits no discharge. Left eye exhibits no discharge.  Neck: Normal range of motion. Neck supple.  Cardiovascular: Normal rate, regular rhythm and normal heart sounds.   Pulmonary/Chest: Effort normal and breath sounds normal. No respiratory distress. She has no wheezes.  Abdominal: Soft. Bowel sounds are normal. She exhibits no distension. There is no tenderness.  Lymphadenopathy:       Head (right side): No submental, no submandibular and no tonsillar adenopathy present.       Head (left side): No submental, no submandibular and no tonsillar adenopathy present.    She has no cervical adenopathy.  Neurological: She is alert and oriented to person, place, and time. No cranial nerve deficit.  Skin: Skin is warm and dry. No erythema.  Psychiatric: She has a normal mood and affect.    ED Course  Procedures (including critical care time) DIAGNOSTIC STUDIES: Oxygen Saturation is 97% on RA, adequate by my interpretation.    COORDINATION OF CARE: 3:32 PM Discussed treatment plan which includes Penicillin, Vicodin, ibuprofen, salt water gargles and dental referral with pt at bedside and pt agreed to plan. Return precautions advised.   Labs Reviewed - No data to display No results found.   1. Pain, dental       MDM  [name] presents for dental pain.  Patient with toothache.  No gross abscess.  Exam unconcerning for Ludwig's angina or spread of infection.  Will treat with penicillin and pain medicine.  Urged patient to follow-up with dentist.  I have also discussed reasons to return immediately  to the ER.  Patient expresses understanding and agrees with plan.  I personally performed the services described in this documentation, which was scribed in my presence. The recorded information has been reviewed and is accurate.    Dahlia Client Ching Rabideau, PA-C 02/24/13 2358

## 2013-04-20 ENCOUNTER — Ambulatory Visit: Payer: Self-pay

## 2014-02-01 ENCOUNTER — Ambulatory Visit
Admission: RE | Admit: 2014-02-01 | Discharge: 2014-02-01 | Disposition: A | Discharge status: Home / Self Care | Payer: No Typology Code available for payment source | Source: Ambulatory Visit | Attending: Infectious Disease | Admitting: Infectious Disease

## 2014-02-01 ENCOUNTER — Other Ambulatory Visit: Payer: Self-pay | Admitting: Infectious Disease

## 2014-02-01 DIAGNOSIS — R7611 Nonspecific reaction to tuberculin skin test without active tuberculosis: Secondary | ICD-10-CM

## 2016-01-30 ENCOUNTER — Encounter (HOSPITAL_COMMUNITY): Payer: Self-pay | Admitting: *Deleted

## 2016-01-30 ENCOUNTER — Emergency Department (HOSPITAL_COMMUNITY)
Admission: EM | Admit: 2016-01-30 | Discharge: 2016-01-30 | Disposition: A | Discharge status: Home / Self Care | Payer: Self-pay | Attending: Emergency Medicine | Admitting: Emergency Medicine

## 2016-01-30 ENCOUNTER — Emergency Department (HOSPITAL_COMMUNITY): Payer: Self-pay

## 2016-01-30 DIAGNOSIS — M545 Low back pain: Secondary | ICD-10-CM | POA: Insufficient documentation

## 2016-01-30 DIAGNOSIS — F17210 Nicotine dependence, cigarettes, uncomplicated: Secondary | ICD-10-CM | POA: Insufficient documentation

## 2016-01-30 DIAGNOSIS — Z7982 Long term (current) use of aspirin: Secondary | ICD-10-CM | POA: Insufficient documentation

## 2016-01-30 DIAGNOSIS — J40 Bronchitis, not specified as acute or chronic: Secondary | ICD-10-CM | POA: Insufficient documentation

## 2016-01-30 MED ORDER — BENZONATATE 100 MG PO CAPS: 100.0000 mg | ORAL_CAPSULE | Freq: Three times a day (TID) | ORAL | Status: DC | PRN

## 2016-01-30 MED ORDER — ALBUTEROL SULFATE HFA 108 (90 BASE) MCG/ACT IN AERS
2.0000 | INHALATION_SPRAY | RESPIRATORY_TRACT | Status: DC | PRN
  Administered 2016-01-30: 2 via RESPIRATORY_TRACT
  Filled 2016-01-30: qty 6.7

## 2016-01-30 NOTE — ED Provider Notes (Signed)
CSN: [id number]     Arrival date & time 01/30/16  [id number] History   First MD Initiated Contact with Patient 01/30/16 0919     Chief Complaint  Patient presents with  . Back Pain  . Cough     (Consider location/radiation/quality/duration/timing/severity/associated sxs/prior Treatment) HPI  Pt presenting with c/o 3 weeks of cough.  She states the cough is dry and tickly- occasionally produces sputum.  No fever/chills.  Cough is worse at night and keeps her from sleeping.  No vomiting, no chest pain. No leg swelling.  No shortness of breath.  She also c/o low mid back pain which has been ongoing for the past week- no recent injuries or falls.  Cough is her primary complaint for the visit today.  There are no other associated systemic symptoms, there are no other alleviating or modifying factors.  She has not had any treatment prior to arrival.   History reviewed. No pertinent past medical history. Past Surgical History  Procedure Laterality Date  . Tubal ligation     No family history on file. Social History  Substance Use Topics  . Smoking status: Current Every Day Smoker -- 0.50 packs/day    Types: Cigarettes  . Smokeless tobacco: None     Comment: pt states 'i don't smoke that much'  . Alcohol Use: Yes     Comment: occ   OB History    No data available     Review of Systems  ROS reviewed and all otherwise negative except for mentioned in HPI    Allergies  Review of patient's allergies indicates no known allergies.  Home Medications   Prior to Admission medications   Medication Sig Start Date End Date Taking? Authorizing Provider  aspirin 325 MG tablet Take 325 mg by mouth daily as needed (pain).   Yes Historical Provider, MD  ibuprofen (ADVIL,MOTRIN) 200 MG tablet Take 400 mg by mouth every 6 (six) hours as needed (pain).   Yes Historical Provider, MD  benzonatate (TESSALON PERLES) 100 MG capsule Take 1 capsule (100 mg total) by mouth 3 (three) times daily as needed for  cough. 01/30/16   Jerelyn ScottMartha Linker, MD   BP 121/61 mmHg  Pulse 80  Temp(Src) 98.5 F (36.9 C) (Oral)  Resp 25  Ht 5' 4.5" (1.638 m)  Wt 117.567 kg  BMI 43.82 kg/m2  SpO2 97%  LMP 12/18/2015  Vitals reviewed Physical Exam  Physical Examination: General appearance - alert, well appearing, and in no distress Mental status - alert, oriented to person, place, and time Eyes - no conjunctival injection, no scleral icterus Mouth - mucous membranes moist, pharynx normal without lesions Chest - clear to auscultation, no wheezes, rales or rhonchi, symmetric air entry Heart - normal rate, regular rhythm, normal S1, S2, no murmurs, rubs, clicks or gallops Abdomen - soft, nontender, nondistended, no masses or organomegaly Back- no midline tenderness of c/t/l spine, no cva tenderness Neurological - alert, oriented, normal speech Extremities - peripheral pulses normal, no pedal edema, no clubbing or cyanosis Skin - normal coloration and turgor, no rashes  ED Course  Procedures (including critical care time) Labs Review Labs Reviewed - No data to display  Imaging Review Dg Chest 2 View  01/30/2016  CLINICAL DATA:  Productive cough starting 3 weeks ago EXAM: CHEST  2 VIEW COMPARISON:  02/01/2014 FINDINGS: Cardiomediastinal silhouette is stable. No acute infiltrate or pleural effusion. No pulmonary edema. Bony thorax is unremarkable. IMPRESSION: No active cardiopulmonary disease. Electronically Signed   By:  Natasha Mead M.D.   On: 01/30/2016 09:14   I have personally reviewed and evaluated these images and lab results as part of my medical decision-making.   EKG Interpretation None      MDM   Final diagnoses:  Bronchitis    Pt presenting with cough- symptoms most likely due to a post viral bronchitis.  Lungs are clear, normal CXR.   Patient is overall nontoxic and well hydrated in appearance.  Pt given albuterol inhaler as well as rx for tessalon perles.  Discharged with strict return  precautions.  Pt agreeable with plan.     Jerelyn Scott, MD 01/31/16 [phone]

## 2016-01-30 NOTE — ED Notes (Addendum)
Pt reports productive cough onset x 3 wks with clear sputum, pt reports mid chest tightness with cough & itchy throat, pt c/o SOB, pt denies n/v/d, pt c/o lower mid back pain onset x 1 wk, denies injury, pt ambulatory, pt A&O x4

## 2016-04-08 ENCOUNTER — Emergency Department (HOSPITAL_COMMUNITY)
Admission: EM | Admit: 2016-04-08 | Discharge: 2016-04-08 | Disposition: A | Discharge status: Home / Self Care | Payer: Self-pay | Attending: Emergency Medicine | Admitting: Emergency Medicine

## 2016-04-08 ENCOUNTER — Emergency Department (HOSPITAL_COMMUNITY): Payer: Self-pay

## 2016-04-08 ENCOUNTER — Encounter (HOSPITAL_COMMUNITY): Payer: Self-pay | Admitting: Emergency Medicine

## 2016-04-08 DIAGNOSIS — Z79899 Other long term (current) drug therapy: Secondary | ICD-10-CM | POA: Insufficient documentation

## 2016-04-08 DIAGNOSIS — R0789 Other chest pain: Secondary | ICD-10-CM | POA: Insufficient documentation

## 2016-04-08 DIAGNOSIS — J40 Bronchitis, not specified as acute or chronic: Secondary | ICD-10-CM | POA: Insufficient documentation

## 2016-04-08 DIAGNOSIS — F17210 Nicotine dependence, cigarettes, uncomplicated: Secondary | ICD-10-CM | POA: Insufficient documentation

## 2016-04-08 DIAGNOSIS — Z7982 Long term (current) use of aspirin: Secondary | ICD-10-CM | POA: Insufficient documentation

## 2016-04-08 LAB — BASIC METABOLIC PANEL
ANION GAP: 5 (ref 5–15)
BUN: 10 mg/dL (ref 6–20)
CALCIUM: 9 mg/dL (ref 8.9–10.3)
CO2: 30 mmol/L (ref 22–32)
CREATININE: 1.31 mg/dL — AB (ref 0.44–1.00)
Chloride: 103 mmol/L (ref 101–111)
GFR, EST AFRICAN AMERICAN: 56 mL/min — AB (ref >60)
GFR, EST NON AFRICAN AMERICAN: 49 mL/min — AB (ref >60)
Glucose, Bld: 94 mg/dL (ref 65–99)
Potassium: 4.4 mmol/L (ref 3.5–5.1)
SODIUM: 138 mmol/L (ref 135–145)

## 2016-04-08 LAB — I-STAT TROPONIN, ED: TROPONIN I, POC: 0.01 ng/mL (ref 0.00–0.08)

## 2016-04-08 LAB — CBC
HCT: 42.2 % (ref 36.0–46.0)
HEMOGLOBIN: 13.4 g/dL (ref 12.0–15.0)
MCH: 30.1 pg (ref 26.0–34.0)
MCHC: 31.8 g/dL (ref 30.0–36.0)
MCV: 94.8 fL (ref 78.0–100.0)
PLATELETS: 271 10*3/uL (ref 150–400)
RBC: 4.45 MIL/uL (ref 3.87–5.11)
RDW: 13.5 % (ref 11.5–15.5)
WBC: 5.4 10*3/uL (ref 4.0–10.5)

## 2016-04-08 MED ORDER — AZITHROMYCIN 250 MG PO TABS: 250.0000 mg | ORAL_TABLET | Freq: Every day | ORAL | 0 refills | Status: DC

## 2016-04-08 MED ORDER — ALBUTEROL SULFATE HFA 108 (90 BASE) MCG/ACT IN AERS: 1.0000 | INHALATION_SPRAY | Freq: Four times a day (QID) | RESPIRATORY_TRACT | 0 refills | Status: DC | PRN

## 2016-04-08 MED ORDER — GUAIFENESIN-CODEINE 100-10 MG/5ML PO SOLN: 5.0000 mL | Freq: Three times a day (TID) | ORAL | 0 refills | Status: DC | PRN

## 2016-04-08 MED ORDER — ALBUTEROL SULFATE HFA 108 (90 BASE) MCG/ACT IN AERS
2.0000 | INHALATION_SPRAY | RESPIRATORY_TRACT | Status: DC | PRN
  Administered 2016-04-08: 2 via RESPIRATORY_TRACT
  Filled 2016-04-08: qty 6.7

## 2016-04-08 MED ORDER — PREDNISONE 20 MG PO TABS
60.0000 mg | ORAL_TABLET | Freq: Once | ORAL | Status: AC
  Administered 2016-04-08: 60 mg via ORAL
  Filled 2016-04-08: qty 3

## 2016-04-08 MED ORDER — PREDNISONE 20 MG PO TABS: 40.0000 mg | ORAL_TABLET | Freq: Every day | ORAL | 0 refills | Status: DC

## 2016-04-08 MED ORDER — IPRATROPIUM-ALBUTEROL 0.5-2.5 (3) MG/3ML IN SOLN
3.0000 mL | Freq: Once | RESPIRATORY_TRACT | Status: AC
  Administered 2016-04-08: 3 mL via RESPIRATORY_TRACT
  Filled 2016-04-08: qty 3

## 2016-04-08 NOTE — ED Provider Notes (Signed)
[facility] DEPT Provider Note   CSN: [id number] Arrival date & time: 04/08/16  5284  First Provider Contact:  First MD Initiated Contact with Patient 04/08/16 [phone]        History   Chief Complaint Chief Complaint  Patient presents with  . Chest Pain  . Cough  . Back Pain    HPI Tonya Vaughn is a 45 y.o. female.  The history is provided by the patient.  Cough  This is a recurrent problem. The current episode started more than 1 week ago (2 1/2 weeks). The problem occurs constantly. The problem has not changed since onset.The cough is non-productive. There has been no fever. Associated symptoms include chest pain (only when coughing), shortness of breath (can't catch my breath) and wheezing. Pertinent negatives include no chills, no sweats, no weight loss, no ear congestion, no headaches, no rhinorrhea, no sore throat and no myalgias. She is a smoker. Her past medical history is significant for bronchitis. Her past medical history does not include pneumonia, COPD or asthma.   Patient is a 45 year old female, current smoker, presents to emergency room with 2-1/2 weeks of persistent nonproductive cough associated with intermittent chest tightness and wheeze. She had a albuterol inhaler which was given to her roughly a month ago, she ran out one week ago however before that it was helping when she had chest tightness or felt short of breath. She reports being evaluated recently and states she was given Jerilynn Som however they did not improve her cough much. She reports chest pain which she describes as soreness in her central chest and lateral and posterior ribs only with coughing. She denies any productive sputum, she denies any fever, chills, night sweats, weight loss, fatigue.  She denies any recent URI symptoms, no rhinitis or postnasal drip. No sneezing. She states that her symptoms are worse when she goes outside.  Patient denies history of COPD or asthma. No other associated  symptoms including no abdominal pain, nausea, vomiting, rash.  Patient also denies palpitations, lower extremity edema, orthopnea, PND.  Shortness of breath is nonexertional and his pain is nonexertional.  History reviewed. No pertinent past medical history.  Patient Active Problem List   Diagnosis Date Noted  . Post-viral cough syndrome 09/24/2011  . Well woman exam 05/05/2011  . CIGARETTE SMOKER 04/06/2010  . KNEE PAIN, BILATERAL 04/06/2010  . GASTROESOPHAGEAL REFLUX DISEASE, MILD 01/11/2010  . OBESITY, UNSPECIFIED 12/15/2009  . SLEEP APNEA 12/15/2009  . DEPRESSION 12/14/2009    Past Surgical History:  Procedure Laterality Date  . TUBAL LIGATION      OB History    No data available       Home Medications    Prior to Admission medications   Medication Sig Start Date End Date Taking? Authorizing Provider  albuterol (PROVENTIL HFA;VENTOLIN HFA) 108 (90 Base) MCG/ACT inhaler Inhale 1-2 puffs into the lungs every 6 (six) hours as needed for wheezing or shortness of breath. 04/08/16   Danelle Berry, PA-C  aspirin 325 MG tablet Take 325 mg by mouth daily as needed (pain).    Historical Provider, MD  azithromycin (ZITHROMAX) 250 MG tablet Take 1 tablet (250 mg total) by mouth daily. Take first 2 tablets together, then 1 every day until finished. 04/08/16   Danelle Berry, PA-C  benzonatate (TESSALON PERLES) 100 MG capsule Take 1 capsule (100 mg total) by mouth 3 (three) times daily as needed for cough. 01/30/16   Jerelyn Scott, MD  guaiFENesin-codeine 100-10 MG/5ML syrup Take  5 mLs by mouth 3 (three) times daily as needed for cough. 04/08/16   Danelle Berry, PA-C  ibuprofen (ADVIL,MOTRIN) 200 MG tablet Take 400 mg by mouth every 6 (six) hours as needed (pain).    Historical Provider, MD  predniSONE (DELTASONE) 20 MG tablet Take 2 tablets (40 mg total) by mouth daily. 04/08/16   Danelle Berry, PA-C    Family History No family history on file.  Social History Social History  Substance Use  Topics  . Smoking status: Current Every Day Smoker    Packs/day: 0.50    Types: Cigarettes  . Smokeless tobacco: Current User     Comment: pt states 'i don't smoke that much'  . Alcohol use Yes     Comment: occ     Allergies   Review of patient's allergies indicates no known allergies.   Review of Systems Review of Systems  Constitutional: Negative for chills and weight loss.  HENT: Negative for rhinorrhea and sore throat.   Respiratory: Positive for cough, shortness of breath (can't catch my breath) and wheezing.   Cardiovascular: Positive for chest pain (only when coughing).  Musculoskeletal: Negative for myalgias.  Neurological: Negative for headaches.  All other systems reviewed and are negative.    Physical Exam Updated Vital Signs BP 159/81   Pulse 68   Temp 98.4 F (36.9 C) (Oral)   Resp 15   Ht 5' 4.5" (1.638 m)   Wt 121.1 kg   LMP 02/17/2016   SpO2 100%   BMI 45.12 kg/m   Physical Exam  Constitutional: She is oriented to person, place, and time. She appears well-developed and well-nourished. No distress.  HENT:  Head: Normocephalic and atraumatic.  Nose: Nose normal.  Mouth/Throat: Oropharynx is clear and moist. No oropharyngeal exudate.  Eyes: Conjunctivae and EOM are normal. Pupils are equal, round, and reactive to light. Right eye exhibits no discharge. Left eye exhibits no discharge. No scleral icterus.  Neck: Normal range of motion. No JVD present. No tracheal deviation present. No thyromegaly present.  Cardiovascular: Normal rate, regular rhythm, normal heart sounds and intact distal pulses.  Exam reveals no gallop and no friction rub.   No murmur heard. Pulmonary/Chest: Effort normal. No respiratory distress. She has wheezes. She has no rales. She exhibits tenderness.  Normal respirations, no retractions Scattered rhonchi and expiratory wheeze No Rales Central anterior chest wall tenderness to palpation, bilateral lateral ribs tender to palpation   Abdominal: Soft. Bowel sounds are normal. She exhibits no distension and no mass. There is no tenderness. There is no rebound and no guarding.  Musculoskeletal: Normal range of motion. She exhibits no edema or tenderness.  Lymphadenopathy:    She has no cervical adenopathy.  Neurological: She is alert and oriented to person, place, and time. She has normal reflexes. She displays normal reflexes. No cranial nerve deficit. She exhibits normal muscle tone. Coordination normal.  Skin: Skin is warm and dry. No rash noted. She is not diaphoretic. No erythema. No pallor.  Brisk capillary refill, no cyanosis or clubbing  Psychiatric: She has a normal mood and affect. Her behavior is normal. Judgment and thought content normal.  Nursing note and vitals reviewed.    ED Treatments / Results  Labs (all labs ordered are listed, but only abnormal results are displayed) Labs Reviewed  BASIC METABOLIC PANEL - Abnormal; Notable for the following:       Result Value   Creatinine, Ser 1.31 (*)    GFR calc non Af Denyse Dago  49 (*)    GFR calc Af Amer 56 (*)    All other components within normal limits  CBC  I-STAT TROPOININ, ED    EKG  EKG Interpretation None       Radiology Dg Chest 2 View  Result Date: 04/08/2016 CLINICAL DATA:  Patient with productive cough for 2 weeks. Recent diagnosis of bronchitis. EXAM: CHEST  2 VIEW COMPARISON:  Chest radiograph 01/30/2016. FINDINGS: The heart size and mediastinal contours are within normal limits. Both lungs are clear. The visualized skeletal structures are unremarkable. IMPRESSION: No active cardiopulmonary disease. Electronically Signed   By: Annia Belt M.D.   On: 04/08/2016 09:06   Procedures Procedures (including critical care time)  Medications Ordered in ED Medications  predniSONE (DELTASONE) tablet 60 mg (not administered)  albuterol (PROVENTIL HFA;VENTOLIN HFA) 108 (90 Base) MCG/ACT inhaler 2 puff (not administered)  ipratropium-albuterol  (DUONEB) 0.5-2.5 (3) MG/3ML nebulizer solution 3 mL (3 mLs Nebulization Given 04/08/16 0939)     Initial Impression / Assessment and Plan / ED Course  I have reviewed the triage vital signs and the nursing notes.  Pertinent labs & imaging results that were available during my care of the patient were reviewed by me and considered in my medical decision making (see chart for details).  Clinical Course  Value Comment By Time  Sodium: 138 (Reviewed) Danelle Berry, PA-C 07/24 [phone]  Creatinine: (!) 1.31 Mildly elevated sCr, no recent to compare to, encouraged pt to have rechecked with follow up appointment for monitoring of kidney function Danelle Berry, PA-C 07/24 0943  WBC: 5.4 No leukocytosis Danelle Berry, PA-C 07/24 0944  Troponin i, poc: 0.01 Negative troponin Danelle Berry, PA-C 07/24 0944  ED EKG within 10 minutes EKG reviewed by me, normal sinus rhythm, no ST elevation or depression Danelle Berry, PA-C 07/24 0944  DG Chest 2 View Chest x-ray negative for infiltrate, no pulmonary edema Danelle Berry, PA-C 07/24 0945   Patient is to be discharged with recommendation to follow up with PCP, case management will assist to help get follow-up, patient also instructed come to the ER if symptoms severely worsen, return precautions discussed. Chest pain is not likely of cardiac etiology, perc negative, VSS, no tracheal deviation, no JVD or new murmur, RRR, EKG without acute abnormalities, negative troponin, and negative CXR. Chest pain is reproducible on exam likely chest wall soreness from excessive coughing, patient has been diagnosed with bronchitis, will be treated with Z-Pak given prolonged symptoms, steroid burst and inhalers. Patient encouraged to return to the ED is CP becomes exertional, associated with diaphoresis or nausea, radiates to left jaw/arm, worsens or becomes concerning in any way. Pt appears reliable for follow up and is agreeable to discharge.    Final Clinical Impressions(s) / ED Diagnoses     Final diagnoses:  Bronchitis    New Prescriptions New Prescriptions   ALBUTEROL (PROVENTIL HFA;VENTOLIN HFA) 108 (90 BASE) MCG/ACT INHALER    Inhale 1-2 puffs into the lungs every 6 (six) hours as needed for wheezing or shortness of breath.   AZITHROMYCIN (ZITHROMAX) 250 MG TABLET    Take 1 tablet (250 mg total) by mouth daily. Take first 2 tablets together, then 1 every day until finished.   GUAIFENESIN-CODEINE 100-10 MG/5ML SYRUP    Take 5 mLs by mouth 3 (three) times daily as needed for cough.   PREDNISONE (DELTASONE) 20 MG TABLET    Take 2 tablets (40 mg total) by mouth daily.     Danelle Berry, PA-C  04/08/16 1610    Gwyneth Sprout, MD 04/08/16 2123

## 2016-04-08 NOTE — Progress Notes (Signed)
ED CM noted this pt with a CM consult for f/u pcp services and pt is working on orange card Cm noted pt [place] in [place] code [phone] Cm called and spoke with Velna Hatchet at [facility] medicine of [place]enn[place] Nip who states pt has an already scheduled appt on 04/26/16 at 1:45 Pm with Gwinda Passe for care Spoke with pt agreed to keep this appt  Entered in [facility] ED d/c instructions and requested ED RN Millie to reprint pt ED d/c instructions so she could have a copy of her appt info  Entered in d/c instructions  [facility]   [phone] [address] [place] [address]    Next Steps: Go on 04/26/2016    Instructions: Please go to your already scheduled appointment with Ardine Eng on 04/26/16 at 1:45 pm Please bring all ID cards, list of medications and arrive 15 minutes early if possible

## 2016-04-08 NOTE — ED Notes (Signed)
RN at bedside

## 2016-07-25 ENCOUNTER — Encounter (HOSPITAL_COMMUNITY): Payer: Self-pay | Admitting: Emergency Medicine

## 2016-07-25 ENCOUNTER — Emergency Department (HOSPITAL_COMMUNITY): Payer: Self-pay

## 2016-07-25 ENCOUNTER — Emergency Department (HOSPITAL_COMMUNITY)
Admission: EM | Admit: 2016-07-25 | Discharge: 2016-07-25 | Disposition: A | Discharge status: Home / Self Care | Payer: Self-pay | Attending: Emergency Medicine | Admitting: Emergency Medicine

## 2016-07-25 DIAGNOSIS — R0602 Shortness of breath: Secondary | ICD-10-CM | POA: Insufficient documentation

## 2016-07-25 DIAGNOSIS — A599 Trichomoniasis, unspecified: Secondary | ICD-10-CM | POA: Insufficient documentation

## 2016-07-25 DIAGNOSIS — J069 Acute upper respiratory infection, unspecified: Secondary | ICD-10-CM | POA: Insufficient documentation

## 2016-07-25 DIAGNOSIS — F17210 Nicotine dependence, cigarettes, uncomplicated: Secondary | ICD-10-CM | POA: Insufficient documentation

## 2016-07-25 DIAGNOSIS — Z79899 Other long term (current) drug therapy: Secondary | ICD-10-CM | POA: Insufficient documentation

## 2016-07-25 DIAGNOSIS — Z7982 Long term (current) use of aspirin: Secondary | ICD-10-CM | POA: Insufficient documentation

## 2016-07-25 LAB — CBC WITH DIFFERENTIAL/PLATELET
Basophils Absolute: 0 10*3/uL (ref 0.0–0.1)
Basophils Relative: 1 %
Eosinophils Absolute: 0.1 10*3/uL (ref 0.0–0.7)
Eosinophils Relative: 1 %
HCT: 39.2 % (ref 36.0–46.0)
Hemoglobin: 12.7 g/dL (ref 12.0–15.0)
Lymphocytes Relative: 30 %
Lymphs Abs: 1.6 10*3/uL (ref 0.7–4.0)
MCH: 30 pg (ref 26.0–34.0)
MCHC: 32.4 g/dL (ref 30.0–36.0)
MCV: 92.7 fL (ref 78.0–100.0)
Monocytes Absolute: 0.3 10*3/uL (ref 0.1–1.0)
Monocytes Relative: 6 %
Neutro Abs: 3.2 10*3/uL (ref 1.7–7.7)
Neutrophils Relative %: 62 %
Platelets: 284 10*3/uL (ref 150–400)
RBC: 4.23 MIL/uL (ref 3.87–5.11)
RDW: 13.6 % (ref 11.5–15.5)
WBC: 5.2 10*3/uL (ref 4.0–10.5)

## 2016-07-25 LAB — URINALYSIS, ROUTINE W REFLEX MICROSCOPIC
Glucose, UA: NEGATIVE mg/dL
Hgb urine dipstick: NEGATIVE
Ketones, ur: NEGATIVE mg/dL
Nitrite: NEGATIVE
Protein, ur: NEGATIVE mg/dL
Specific Gravity, Urine: 1.026 (ref 1.005–1.030)
pH: 5.5 (ref 5.0–8.0)

## 2016-07-25 LAB — I-STAT TROPONIN, ED: Troponin i, poc: 0 ng/mL (ref 0.00–0.08)

## 2016-07-25 LAB — URINE MICROSCOPIC-ADD ON
Bacteria, UA: NONE SEEN
RBC / HPF: NONE SEEN RBC/hpf (ref 0–5)

## 2016-07-25 LAB — BASIC METABOLIC PANEL
Anion gap: 5 (ref 5–15)
BUN: 10 mg/dL (ref 6–20)
CO2: 25 mmol/L (ref 22–32)
Calcium: 8.9 mg/dL (ref 8.9–10.3)
Chloride: 105 mmol/L (ref 101–111)
Creatinine, Ser: 1.26 mg/dL — ABNORMAL HIGH (ref 0.44–1.00)
GFR calc Af Amer: 59 mL/min — ABNORMAL LOW (ref >60)
GFR calc non Af Amer: 51 mL/min — ABNORMAL LOW (ref >60)
Glucose, Bld: 110 mg/dL — ABNORMAL HIGH (ref 65–99)
Potassium: 4.7 mmol/L (ref 3.5–5.1)
Sodium: 135 mmol/L (ref 135–145)

## 2016-07-25 LAB — POC URINE PREG, ED: Preg Test, Ur: NEGATIVE

## 2016-07-25 LAB — BRAIN NATRIURETIC PEPTIDE: B Natriuretic Peptide: 38 pg/mL (ref 0.0–100.0)

## 2016-07-25 MED ORDER — METRONIDAZOLE 500 MG PO TABS
2000.0000 mg | ORAL_TABLET | Freq: Once | ORAL | Status: AC
  Administered 2016-07-25: 2000 mg via ORAL
  Filled 2016-07-25: qty 4

## 2016-07-25 MED ORDER — ALBUTEROL SULFATE HFA 108 (90 BASE) MCG/ACT IN AERS
1.0000 | INHALATION_SPRAY | Freq: Once | RESPIRATORY_TRACT | Status: AC
  Administered 2016-07-25: 2 via RESPIRATORY_TRACT
  Filled 2016-07-25: qty 6.7

## 2016-07-25 MED ORDER — PREDNISONE 20 MG PO TABS: 40.0000 mg | ORAL_TABLET | Freq: Every day | ORAL | 0 refills | Status: DC

## 2016-07-25 MED ORDER — GUAIFENESIN-CODEINE 100-10 MG/5ML PO SOLN: 5.0000 mL | Freq: Three times a day (TID) | ORAL | 0 refills | Status: DC | PRN

## 2016-07-25 MED ORDER — CETIRIZINE-PSEUDOEPHEDRINE ER 5-120 MG PO TB12: 1.0000 | ORAL_TABLET | Freq: Two times a day (BID) | ORAL | 0 refills | Status: DC

## 2016-07-25 NOTE — Discharge Instructions (Signed)
Medications: Guaifenesin-codeine, prednisone, Zyrtec-D, albuterol inhaler  Treatment: Take cough medicine-codeine 3 times daily as needed for cough. Do not drive or operate she would take this medication and only take as prescribed. Take prednisone as prescribed for 5 days. Take Zyrtec-D twice daily as needed for possible allergies and your congestion. Use albuterol inhaler every 4-6 hours as needed for cough, shortness of breath, or wheezing. Do not drink alcohol within 24 hours of today, as the medicine you have taken for your Trichomonas infection can make you very sick in combination with alcohol.  Follow-up: Please follow-up with your primary care provider within the next 2-3 days for follow-up and further evaluation and treatment. Please return to emergency department if you develop any new or worsening symptoms.

## 2016-07-25 NOTE — ED Triage Notes (Signed)
Pt coming from home. Pt states she has been here twice before for bronchitis tx. She was prescribed a z-pak and azithromycin without any relief. Pt states she started to experience back pain last night in the lower lumbar region.

## 2016-07-25 NOTE — ED Notes (Signed)
Patient transported to X-ray 

## 2016-07-25 NOTE — ED Provider Notes (Signed)
[facility] Provider Note   CSN: [id number] Arrival date & time: 07/25/16  0845     History   Chief Complaint Chief Complaint  Patient presents with  . Cough    HPI Tonya Vaughn is a 45 y.o. female who presents with a over 1 week history of cough. Patient has had associated shortness of breath that is worse with coughing and exertion. Patient has had shortness of breath on exertion since this summer. Patient has been seen twice before and diagnosed with bronchitis. Patient has had some relief with albuterol inhaler. Patient has used leftover guaifenesin-codeine cough syrup without any relief. Patient denies any fevers, chest pain, abdominal pain, nausea, vomiting, urinary symptoms. Patient denies any recent long trips, surgeries, cancer, new leg pain or swelling, exact denies estrogen use. Patient also reports a one-day history of minor low back pain. She denies any night sweats, weight loss, IV drug use, saddle anesthesia, bowel/bladder incontinence.  HPI  No past medical history on file.  Patient Active Problem List   Diagnosis Date Noted  . Post-viral cough syndrome 09/24/2011  . Well woman exam 05/05/2011  . CIGARETTE SMOKER 04/06/2010  . KNEE PAIN, BILATERAL 04/06/2010  . GASTROESOPHAGEAL REFLUX DISEASE, MILD 01/11/2010  . OBESITY, UNSPECIFIED 12/15/2009  . SLEEP APNEA 12/15/2009  . DEPRESSION 12/14/2009    Past Surgical History:  Procedure Laterality Date  . TUBAL LIGATION    . TUBAL LIGATION  2009    OB History    No data available       Home Medications    Prior to Admission medications   Medication Sig Start Date End Date Taking? Authorizing Provider  aspirin 325 MG tablet Take 325 mg by mouth daily as needed (pain).   Yes Historical Provider, MD  ibuprofen (ADVIL,MOTRIN) 200 MG tablet Take 200 mg by mouth every 6 (six) hours as needed (pain).    Yes Historical Provider, MD  albuterol (PROVENTIL HFA;VENTOLIN HFA) 108 (90 Base) MCG/ACT inhaler  Inhale 1-2 puffs into the lungs every 6 (six) hours as needed for wheezing or shortness of breath. 04/08/16   Danelle BerryLeisa Tapia, PA-C  azithromycin (ZITHROMAX) 250 MG tablet Take 1 tablet (250 mg total) by mouth daily. Take first 2 tablets together, then 1 every day until finished. Patient not taking: Reported on 07/25/2016 04/08/16   Danelle BerryLeisa Tapia, PA-C  benzonatate (TESSALON PERLES) 100 MG capsule Take 1 capsule (100 mg total) by mouth 3 (three) times daily as needed for cough. Patient not taking: Reported on 07/25/2016 01/30/16   Jerelyn ScottMartha Linker, MD  cetirizine-pseudoephedrine (ZYRTEC-D) 5-120 MG tablet Take 1 tablet by mouth 2 (two) times daily. 07/25/16   Rylin Saez M Lindyn Vossler, PA-C  guaiFENesin-codeine 100-10 MG/5ML syrup Take 5 mLs by mouth 3 (three) times daily as needed for cough. 07/25/16   Emi HolesAlexandra M Starlette Thurow, PA-C  predniSONE (DELTASONE) 20 MG tablet Take 2 tablets (40 mg total) by mouth daily. 07/25/16   Emi HolesAlexandra M Kynadee Dam, PA-C    Family History No family history on file.  Social History Social History  Substance Use Topics  . Smoking status: Current Every Day Smoker    Packs/day: 0.50    Types: Cigarettes  . Smokeless tobacco: Current User     Comment: pt states 'i don't smoke that much'  . Alcohol use Yes     Comment: occ     Allergies   Patient has no known allergies.   Review of Systems Review of Systems  Constitutional: Negative for chills, fever and unexpected  weight change.  HENT: Negative for facial swelling and sore throat.   Respiratory: Positive for cough, shortness of breath and wheezing.   Cardiovascular: Negative for chest pain.  Gastrointestinal: Negative for abdominal pain, nausea and vomiting.  Genitourinary: Negative for dysuria.  Musculoskeletal: Negative for back pain.  Skin: Negative for rash and wound.  Neurological: Negative for headaches.  Psychiatric/Behavioral: The patient is not nervous/anxious.      Physical Exam Updated Vital Signs BP 128/76   Pulse 82    Temp 97.8 F (36.6 C) (Oral)   Resp 11   Ht 5\' 4"  (1.626 m)   Wt 123.4 kg   LMP 07/17/2016 Comment: Tubal ligation   SpO2 98%   BMI 46.69 kg/m   Physical Exam  Constitutional: She appears well-developed and well-nourished. No distress.  HENT:  Head: Normocephalic and atraumatic.  Mouth/Throat: Oropharynx is clear and moist. No oropharyngeal exudate.  Eyes: Conjunctivae are normal. Pupils are equal, round, and reactive to light. Right eye exhibits no discharge. Left eye exhibits no discharge. No scleral icterus.  Neck: Normal range of motion. Neck supple. No thyromegaly present.  Cardiovascular: Normal rate, regular rhythm and normal heart sounds.  Exam reveals no gallop and no friction rub.   No murmur heard. Pulmonary/Chest: Effort normal. No stridor. No respiratory distress. She has decreased breath sounds (R lung). She has no wheezes. She has rales (possible to upper R lung).  Abdominal: Soft. Bowel sounds are normal. She exhibits no distension. There is no tenderness. There is no rebound and no guarding.  Musculoskeletal: She exhibits no edema.  No TTP to lumbar spine  Lymphadenopathy:    She has no cervical adenopathy.  Neurological: She is alert. Coordination normal.  Skin: Skin is warm and dry. No rash noted. She is not diaphoretic. No pallor.  Psychiatric: She has a normal mood and affect.  Nursing note and vitals reviewed.    ED Treatments / Results  Labs (all labs ordered are listed, but only abnormal results are displayed) Labs Reviewed  BASIC METABOLIC PANEL - Abnormal; Notable for the following:       Result Value   Glucose, Bld 110 (*)    Creatinine, Ser 1.26 (*)    GFR calc non Af Amer 51 (*)    GFR calc Af Amer 59 (*)    All other components within normal limits  URINALYSIS, ROUTINE W REFLEX MICROSCOPIC (NOT AT [facility]) - Abnormal; Notable for the following:    Color, Urine AMBER (*)    APPearance HAZY (*)    Bilirubin Urine SMALL (*)    Leukocytes, UA  SMALL (*)    All other components within normal limits  URINE MICROSCOPIC-ADD ON - Abnormal; Notable for the following:    Squamous Epithelial / LPF 6-30 (*)    Crystals CA OXALATE CRYSTALS (*)    All other components within normal limits  URINE CULTURE  CBC WITH DIFFERENTIAL/PLATELET  BRAIN NATRIURETIC PEPTIDE  I-STAT TROPOININ, ED  POC URINE PREG, ED    EKG  EKG Interpretation None       Radiology Dg Chest 2 View  Result Date: 07/25/2016 CLINICAL DATA:  Cough for 1 week. Shortness of breath. Back pain for 1 day. EXAM: CHEST  2 VIEW COMPARISON:  04/08/2016 FINDINGS: The cardiomediastinal silhouette is within normal limits. The lungs are well inflated and clear. There is no evidence of pleural effusion or pneumothorax. No acute osseous abnormality is identified. IMPRESSION: No active cardiopulmonary disease. Electronically Signed   By:  Sebastian AcheAllen  Grady M.D.   On: 07/25/2016 09:59    Procedures Procedures (including critical care time)  Medications Ordered in ED Medications  albuterol (PROVENTIL HFA;VENTOLIN HFA) 108 (90 Base) MCG/ACT inhaler 1-2 puff (2 puffs Inhalation Given 07/25/16 1054)  metroNIDAZOLE (FLAGYL) tablet 2,000 mg (2,000 mg Oral Given 07/25/16 1116)     Initial Impression / Assessment and Plan / ED Course  I have reviewed the triage vital signs and the nursing notes.  Pertinent labs & imaging results that were available during my care of the patient were reviewed by me and considered in my medical decision making (see chart for details).  Clinical Course     Suspect bronchitis. PERC negative, doubt ACS. CBC unremarkable. BMP shows creatinine 1.26, which is stable with past elevations. BNP 38. Troponin 0. CXR shows no active cardiopulmonary disease. UA shows small leukocytes, 6-30 epithelial cells, Trichomonas. Urine culture sent and would treat if positive, however suspect dirty sample and/or Trichomonas as cause of small leukocytes. Urine pregnancy negative. EKG  shows NSR. Patient discharged home with guaifenesin-codeine cough syrup, short course of prednisone, Zyrtec-D, albuterol inhaler. Patient to follow-up with PCP for follow-up and further evaluation. Strict return precautions given. Patient understands and agrees with plan. Patient vitals stable throughout ED course and discharged in satisfactory condition.  Final Clinical Impressions(s) / ED Diagnoses   Final diagnoses:  Upper respiratory tract infection, unspecified type  Trichomonas infection    New Prescriptions Discharge Medication List as of 07/25/2016 11:03 AM    START taking these medications   Details  cetirizine-pseudoephedrine (ZYRTEC-D) 5-120 MG tablet Take 1 tablet by mouth 2 (two) times daily., Starting Thu 07/25/2016, Print         Emi Holeslexandra M Pedro Oldenburg, PA-C 07/25/16 1350    Lavera Guiseana Duo Liu, MD 07/25/16 1728

## 2016-07-26 LAB — URINE CULTURE

## 2016-08-26 ENCOUNTER — Encounter (HOSPITAL_COMMUNITY): Payer: Self-pay | Admitting: *Deleted

## 2016-08-26 ENCOUNTER — Emergency Department (HOSPITAL_COMMUNITY)
Admission: EM | Admit: 2016-08-26 | Discharge: 2016-08-26 | Disposition: A | Discharge status: Home / Self Care | Payer: Self-pay | Attending: Emergency Medicine | Admitting: Emergency Medicine

## 2016-08-26 ENCOUNTER — Emergency Department (HOSPITAL_COMMUNITY): Payer: Self-pay

## 2016-08-26 DIAGNOSIS — Z7982 Long term (current) use of aspirin: Secondary | ICD-10-CM | POA: Insufficient documentation

## 2016-08-26 DIAGNOSIS — Y92009 Unspecified place in unspecified non-institutional (private) residence as the place of occurrence of the external cause: Secondary | ICD-10-CM | POA: Insufficient documentation

## 2016-08-26 DIAGNOSIS — M79674 Pain in right toe(s): Secondary | ICD-10-CM | POA: Insufficient documentation

## 2016-08-26 DIAGNOSIS — W109XXA Fall (on) (from) unspecified stairs and steps, initial encounter: Secondary | ICD-10-CM | POA: Insufficient documentation

## 2016-08-26 DIAGNOSIS — Y939 Activity, unspecified: Secondary | ICD-10-CM | POA: Insufficient documentation

## 2016-08-26 DIAGNOSIS — Y999 Unspecified external cause status: Secondary | ICD-10-CM | POA: Insufficient documentation

## 2016-08-26 DIAGNOSIS — F17210 Nicotine dependence, cigarettes, uncomplicated: Secondary | ICD-10-CM | POA: Insufficient documentation

## 2016-08-26 MED ORDER — IBUPROFEN 600 MG PO TABS: 600.0000 mg | ORAL_TABLET | Freq: Four times a day (QID) | ORAL | 0 refills | Status: DC | PRN

## 2016-08-26 NOTE — ED Provider Notes (Signed)
[facility]-EMERGENCY DEPT Provider Note   CSN: [id number] Arrival date & time: 08/26/16  1108  By signing my name below, I, [name], attest that this documentation has been prepared under the direction and in the presence of Terance HartKelly Zeppelin Commisso, PA-C. Electronically Signed: [name], ER Scribe. 04/27/2016. 11:36 AM.   History   Chief Complaint Chief Complaint  Patient presents with  . Fall  . Toe Pain   HPI  HPI Comments: Tonya Vaughn is a 45 y.o. female who presents to the Emergency Department complaining of right great toe swelling and pain since this morning after she fell down her stairs at home. She is having trouble walking due to pain. 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 No numbness or tingling. Has not taken anything for pain. Reports some mild pain over the left thigh but toe pain is more severe.   History reviewed. No pertinent past medical history.  Patient Active Problem List   Diagnosis Date Noted  . Post-viral cough syndrome 09/24/2011  . Well woman exam 05/05/2011  . CIGARETTE SMOKER 04/06/2010  . KNEE PAIN, BILATERAL 04/06/2010  . GASTROESOPHAGEAL REFLUX DISEASE, MILD 01/11/2010  . OBESITY, UNSPECIFIED 12/15/2009  . SLEEP APNEA 12/15/2009  . DEPRESSION 12/14/2009    Past Surgical History:  Procedure Laterality Date  . TUBAL LIGATION    . TUBAL LIGATION  2009    OB History    No data available       Home Medications    Prior to Admission medications   Medication Sig Start Date End Date Taking? Authorizing Provider  albuterol (PROVENTIL HFA;VENTOLIN HFA) 108 (90 Base) MCG/ACT inhaler Inhale 1-2 puffs into the lungs every 6 (six) hours as needed for wheezing or shortness of breath. 04/08/16   Danelle BerryLeisa Tapia, PA-C  aspirin 325 MG tablet Take 325 mg by mouth daily as needed (pain).    Historical Provider, MD  azithromycin (ZITHROMAX) 250 MG tablet Take 1 tablet (250 mg total) by mouth daily. Take first 2 tablets together, then 1 every day until  finished. Patient not taking: Reported on 07/25/2016 04/08/16   Danelle BerryLeisa Tapia, PA-C  benzonatate (TESSALON PERLES) 100 MG capsule Take 1 capsule (100 mg total) by mouth 3 (three) times daily as needed for cough. Patient not taking: Reported on 07/25/2016 01/30/16   Jerelyn ScottMartha Linker, MD  cetirizine-pseudoephedrine (ZYRTEC-D) 5-120 MG tablet Take 1 tablet by mouth 2 (two) times daily. 07/25/16   Alexandra M Law, PA-C  guaiFENesin-codeine 100-10 MG/5ML syrup Take 5 mLs by mouth 3 (three) times daily as needed for cough. 07/25/16   Emi HolesAlexandra M Law, PA-C  ibuprofen (ADVIL,MOTRIN) 200 MG tablet Take 200 mg by mouth every 6 (six) hours as needed (pain).     Historical Provider, MD  predniSONE (DELTASONE) 20 MG tablet Take 2 tablets (40 mg total) by mouth daily. 07/25/16   Emi HolesAlexandra M Law, PA-C    Family History History reviewed. No pertinent family history.  Social History Social History  Substance Use Topics  . Smoking status: Current Every Day Smoker    Packs/day: 0.50    Types: Cigarettes  . Smokeless tobacco: Never Used     Comment: pt states 'i don't smoke that much'  . Alcohol use Yes     Comment: occ     Allergies   Patient has no known allergies.   Review of Systems Review of Systems  Constitutional: Negative for chills and fever.  Musculoskeletal: Positive for arthralgias and gait problem.  Skin: Negative  for color change and wound.  Neurological: Positive for weakness. Negative for numbness.     Physical Exam Updated Vital Signs BP (!) 163/105 (BP Location: Left Arm)   Pulse 75   Temp 98.4 F (36.9 C) (Oral)   Resp 20   Ht 5' 4.5" (1.638 m)   Wt 276 lb (125.2 kg)   SpO2 100%   BMI 46.64 kg/m   Physical Exam  Constitutional: She is oriented to person, place, and time. She appears well-developed and well-nourished. No distress.  HENT:  Head: Normocephalic and atraumatic.  Eyes: Pupils are equal, round, and reactive to light.  Neck: Neck supple.  Cardiovascular: Normal  rate.   Pulmonary/Chest: Effort normal. No respiratory distress.  Musculoskeletal: Normal range of motion.  Right foot: Mild swelling of great toe. No deformity. Mild tenderness to palpation. Decreased ROM of toe. N/V intact.   Neurological: She is alert and oriented to person, place, and time. Coordination normal.  Skin: Skin is warm and dry. She is not diaphoretic.  Psychiatric: She has a normal mood and affect. Her behavior is normal.  Nursing note and vitals reviewed.    ED Treatments / Results  Labs (all labs ordered are listed, but only abnormal results are displayed) Labs Reviewed - No data to display  EKG  EKG Interpretation None       Radiology No results found.  Procedures Procedures (including critical care time)  Medications Ordered in ED Medications - No data to display   Initial Impression / Assessment and Plan / ED Course  I have reviewed the triage vital signs and the nursing notes.  Pertinent labs & imaging results that were available during my care of the patient were reviewed by me and considered in my medical decision making (see chart for details).  Clinical Course    45 year old female presents with great toe pain after a fall. Xray negative for fracture. Advised RICE and NSAIDs. Post-op shoe and crutches offered. Return precautions given.  Final Clinical Impressions(s) / ED Diagnoses   Final diagnoses:  Toe pain, right    New Prescriptions New Prescriptions   No medications on file    I personally performed the services described in this documentation, which was scribed in my presence. The recorded information has been reviewed and is accurate.       Bethel BornKelly Marie Kalissa Grays, PA-C 08/26/16 1351    Geoffery Lyonsouglas Delo, MD 08/27/16 [id number]

## 2016-08-26 NOTE — Progress Notes (Signed)
Orthopedic Tech Progress Note Patient Details:  Tonya BruceKimberly M Vaughn 03/07/1971 [id number]  Ortho Devices Type of Ortho Device: Crutches Ortho Device/Splint Location: Provided and trained pt for crutches. Pt already had post op shoe on right foot.    Alvina ChouWilliams, Johnathon Mittal C 08/26/2016, 1:47 PM

## 2016-08-26 NOTE — ED Triage Notes (Signed)
PT reports falling down basement stairs this AM and has pain with swelling to RT foot and great toe.

## 2016-08-26 NOTE — Discharge Instructions (Signed)
Rest and ice throughout the day Take pain medicine 3 times daily for the next couple days Weight bearing as tolerated

## 2016-08-26 NOTE — ED Notes (Signed)
Ice pack applied to foot

## 2016-10-08 ENCOUNTER — Encounter: Payer: Self-pay | Admitting: Internal Medicine

## 2016-10-08 ENCOUNTER — Ambulatory Visit: Payer: BLUE CROSS/BLUE SHIELD | Attending: Internal Medicine | Admitting: Internal Medicine

## 2016-10-08 VITALS — BP 185/110 | HR 77 | Temp 98.6°F | Resp 16 | Wt 275.6 lb

## 2016-10-08 DIAGNOSIS — I10 Essential (primary) hypertension: Secondary | ICD-10-CM | POA: Insufficient documentation

## 2016-10-08 DIAGNOSIS — Z7982 Long term (current) use of aspirin: Secondary | ICD-10-CM | POA: Insufficient documentation

## 2016-10-08 DIAGNOSIS — Z1329 Encounter for screening for other suspected endocrine disorder: Secondary | ICD-10-CM | POA: Diagnosis not present

## 2016-10-08 DIAGNOSIS — E559 Vitamin D deficiency, unspecified: Secondary | ICD-10-CM | POA: Insufficient documentation

## 2016-10-08 DIAGNOSIS — G4733 Obstructive sleep apnea (adult) (pediatric): Secondary | ICD-10-CM | POA: Insufficient documentation

## 2016-10-08 DIAGNOSIS — Z79899 Other long term (current) drug therapy: Secondary | ICD-10-CM | POA: Insufficient documentation

## 2016-10-08 DIAGNOSIS — F17210 Nicotine dependence, cigarettes, uncomplicated: Secondary | ICD-10-CM | POA: Diagnosis not present

## 2016-10-08 DIAGNOSIS — Z23 Encounter for immunization: Secondary | ICD-10-CM | POA: Diagnosis not present

## 2016-10-08 DIAGNOSIS — Z131 Encounter for screening for diabetes mellitus: Secondary | ICD-10-CM | POA: Insufficient documentation

## 2016-10-08 DIAGNOSIS — Z888 Allergy status to other drugs, medicaments and biological substances status: Secondary | ICD-10-CM | POA: Insufficient documentation

## 2016-10-08 DIAGNOSIS — Z6841 Body Mass Index (BMI) 40.0 and over, adult: Secondary | ICD-10-CM | POA: Insufficient documentation

## 2016-10-08 DIAGNOSIS — G4730 Sleep apnea, unspecified: Secondary | ICD-10-CM

## 2016-10-08 LAB — BASIC METABOLIC PANEL WITH GFR
BUN: 15 mg/dL (ref 7–25)
CHLORIDE: 108 mmol/L (ref 98–110)
CO2: 21 mmol/L (ref 20–31)
CREATININE: 1.17 mg/dL — AB (ref 0.50–1.10)
Calcium: 9 mg/dL (ref 8.6–10.2)
GFR, Est African American: 65 mL/min (ref >=60)
GFR, Est Non African American: 56 mL/min — ABNORMAL LOW (ref >=60)
GLUCOSE: 115 mg/dL — AB (ref 65–99)
POTASSIUM: 4 mmol/L (ref 3.5–5.3)
Sodium: 138 mmol/L (ref 135–146)

## 2016-10-08 LAB — POCT GLYCOSYLATED HEMOGLOBIN (HGB A1C): HEMOGLOBIN A1C: 5.6

## 2016-10-08 LAB — TSH: TSH: 1.17 mIU/L

## 2016-10-08 MED ORDER — NICOTINE POLACRILEX 4 MG MT GUM: 4.0000 mg | CHEWING_GUM | OROMUCOSAL | 0 refills | Status: DC | PRN

## 2016-10-08 MED ORDER — NICOTINE 7 MG/24HR TD PT24: 7.0000 mg | MEDICATED_PATCH | Freq: Every day | TRANSDERMAL | 0 refills | Status: DC

## 2016-10-08 MED ORDER — LOSARTAN POTASSIUM-HCTZ 50-12.5 MG PO TABS: 1.0000 | ORAL_TABLET | Freq: Every day | ORAL | 3 refills | Status: DC

## 2016-10-08 NOTE — Progress Notes (Signed)
Tonya Vaughn, is a 46 y.o. female  YQM:[id number]:[id number]  BMW:[id number]  DOB - 09/19/1970  CC:  Chief Complaint  Patient presents with  . New Patient (Initial Visit)       HPI: Tonya Vaughn is a 46 y.o. female here today to establish medical care., w/ hx of htn, morbid obesity, osa. Has been off her bp rx for a long time. Of note, intolerant of lisinopril in past due to coughing.   She use to have cpap machine years ago, but now lost.   Has 5 kids, most living w/ her mom. She has her 13yo w/ her currently. 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  She denies etoh/drugs, but does smoke 4-5 /cigs a day.   Loves carbohydrates.  Patient has No headache, No chest pain, No abdominal pain - No Nausea, No new weakness tingling or numbness, No Cough - SOB.    Review of Systems: Per hpi, o/w all systems reviewed and negative.    Allergies  Allergen Reactions  . Lisinopril Cough   No past medical history on file. Current Outpatient Prescriptions on File Prior to Visit  Medication Sig Dispense Refill  . albuterol (PROVENTIL HFA;VENTOLIN HFA) 108 (90 Base) MCG/ACT inhaler Inhale 1-2 puffs into the lungs every 6 (six) hours as needed for wheezing or shortness of breath. 1 Inhaler 0  . aspirin 325 MG tablet Take 325 mg by mouth daily as needed (pain).    . cetirizine-pseudoephedrine (ZYRTEC-D) 5-120 MG tablet Take 1 tablet by mouth 2 (two) times daily. (Patient not taking: Reported on 10/08/2016) 30 tablet 0  . guaiFENesin-codeine 100-10 MG/5ML syrup Take 5 mLs by mouth 3 (three) times daily as needed for cough. (Patient not taking: Reported on 10/08/2016) 120 mL 0  . ibuprofen (ADVIL,MOTRIN) 600 MG tablet Take 1 tablet (600 mg total) by mouth every 6 (six) hours as needed. (Patient not taking: Reported on 10/08/2016) 30 tablet 0  . predniSONE (DELTASONE) 20 MG tablet Take 2 tablets (40 mg total) by mouth daily. (Patient not taking: Reported on 10/08/2016) 10 tablet 0   No current facility-administered  medications on file prior to visit.    No family history on file. Social History   Social History  . Marital status: Single    Spouse name: N/A  . Number of children: N/A  . Years of education: N/A   Occupational History  . Not on file.   Social History Main Topics  . Smoking status: Current Every Day Smoker    Packs/day: 0.50    Types: Cigarettes  . Smokeless tobacco: Never Used     Comment: pt states 'i don't smoke that much'  . Alcohol use Yes     Comment: occ  . Drug use: No  . Sexual activity: Not on file   Other Topics Concern  . Not on file   Social History Narrative  . No narrative on file    Objective:   Vitals:   10/08/16 0914  BP: (!) 185/110  Pulse: 77  Resp: 16  Temp: 98.6 F (37 C)    Filed Weights   10/08/16 0914  Weight: 275 lb 9.6 oz (125 kg)    BP Readings from Last 3 Encounters:  10/08/16 (!) 185/110  08/26/16 163/97  07/25/16 128/76    Physical Exam: Constitutional: Patient appears well-developed and well-nourished. No distress. AAOx3, morbid obese, pleasant. HENT: Normocephalic, atraumatic, External right and left ear normal. Oropharynx is clear and moist.  Eyes: Conjunctivae and EOM are  normal. PERRL, no scleral icterus. Neck: Normal ROM. Neck supple. No JVD.  CVS: RRR, S1/S2 +, no murmurs, no gallops, no carotid bruit.  Pulmonary: Effort and breath sounds normal, no stridor, rhonchi, wheezes, rales.  Abdominal: Soft. BS +, obese,  no distension, tenderness, rebound or guarding.  Musculoskeletal: Normal range of motion. No edema and no tenderness.  LE: bilat/ no c/c/e, pulses 2+ bilateral. Neuro: Alert.  muscle tone coordination wnl. No cranial nerve deficit grossly. Skin: Skin is warm and dry. No rash noted. Not diaphoretic. No erythema. No pallor. Psychiatric: Normal mood and affect. Behavior, judgment, thought content normal.  Lab Results  Component Value Date   WBC 5.2 07/25/2016   HGB 12.7 07/25/2016   HCT 39.2  07/25/2016   MCV 92.7 07/25/2016   PLT 284 07/25/2016   Lab Results  Component Value Date   CREATININE 1.26 (H) 07/25/2016   BUN 10 07/25/2016   NA 135 07/25/2016   K 4.7 07/25/2016   CL 105 07/25/2016   CO2 25 07/25/2016    Lab Results  Component Value Date   HGBA1C 5.6 10/08/2016   Lipid Panel  No results found for: CHOL, TRIG, HDL, CHOLHDL, VLDL, LDLCALC      Depression screen PHQ 2/9 10/08/2016  Decreased Interest 1  Down, Depressed, Hopeless 1  PHQ - 2 Score 2  Altered sleeping 3  Tired, decreased energy 1  Change in appetite 2  Feeling bad or failure about yourself  0  Trouble concentrating 0  Moving slowly or fidgety/restless 0  Suicidal thoughts 0  PHQ-9 Score 8    Assessment and plan:   1. HTN (hypertension), malignant Uncontrolled, low salt diet discussed, info provided Started her on hyzaar 50-12.5 qd,  rn bp check in 2 wks w/ Eustace Pen, if sbp >130, than increase hyzaar to 100-25qd - BASIC METABOLIC PANEL WITH GFR  2. Diabetes mellitus screening - POCT glycosylated hemoglobin (Hb A1C) 5.6 - recd low carb diet for weight loss and prevention of dm in future.  3. Encounter for immunization - Flu Vaccine QUAD 36+ mos IM - pneumococcal 23 v today.  4. Vitamin D deficiency - VITAMIN D 25 Hydroxy (Vit-D Deficiency, Fractures)  5. Sleep apnea, unspecified type cpap titration ordered, given hx of osa w/ cpap use in past. - hopefully this is enough, and does not need another sleep study.    6. Morbid obesity (HCC) Weight loss recd. Recd increasing exercise, start by walking daily, and slowly increase to get daily walking. - info on lc/hf/if diet provided for weight loss as well.   7. Thyroid disorder screen - TSH  8. tob abuse Total cessation recd, trial nicoderm patch or nicorette gum to see if helps, tips on quitting discussed and provided  Return in about 4 weeks (around 11/05/2016) for papsmear.  The patient was given clear  instructions to go to ER or return to medical center if symptoms don't improve, worsen or new problems develop. The patient verbalized understanding. The patient was told to call to get lab results if they haven't heard anything in the next week.    This note has been created with Education officer, environmental. Any transcriptional errors are unintentional.   Pete Glatter, MD, MBA/MHA [facility] [place], [place] [phone]   10/08/2016, 12:30 PM

## 2016-10-08 NOTE — Patient Instructions (Addendum)
Arby Barrette RN 2 wks for bp check.   Influenza Virus Vaccine injection (Fluarix) What is this medicine? INFLUENZA VIRUS VACCINE (in floo EN zuh VAHY ruhs vak SEEN) helps to reduce the risk of getting influenza also known as the flu. This medicine may be used for other purposes; ask your health care provider or pharmacist if you have questions. COMMON BRAND NAME(S): Fluarix, Fluzone What should I tell my health care provider before I take this medicine? They need to know if you have any of these conditions: -bleeding disorder like hemophilia -fever or infection -Guillain-Barre syndrome or other neurological problems -immune system problems -infection with the human immunodeficiency virus (HIV) or AIDS -low blood platelet counts -multiple sclerosis -an unusual or allergic reaction to influenza virus vaccine, eggs, chicken proteins, latex, gentamicin, other medicines, foods, dyes or preservatives -pregnant or trying to get pregnant -breast-feeding How should I use this medicine? This vaccine is for injection into a muscle. It is given by a health care professional. A copy of Vaccine Information Statements will be given before each vaccination. Read this sheet carefully each time. The sheet may change frequently. Talk to your pediatrician regarding the use of this medicine in children. Special care may be needed. Overdosage: If you think you have taken too much of this medicine contact a poison control center or emergency room at once. NOTE: This medicine is only for you. Do not share this medicine with others. What if I miss a dose? This does not apply. What may interact with this medicine? -chemotherapy or radiation therapy -medicines that lower your immune system like etanercept, anakinra, infliximab, and adalimumab -medicines that treat or prevent blood clots like warfarin -phenytoin -steroid medicines like prednisone or cortisone -theophylline -vaccines This list may not describe  all possible interactions. Give your health care provider a list of all the medicines, herbs, non-prescription drugs, or dietary supplements you use. Also tell them if you smoke, drink alcohol, or use illegal drugs. Some items may interact with your medicine. What should I watch for while using this medicine? Report any side effects that do not go away within 3 days to your doctor or health care professional. Call your health care provider if any unusual symptoms occur within 6 weeks of receiving this vaccine. You may still catch the flu, but the illness is not usually as bad. You cannot get the flu from the vaccine. The vaccine will not protect against colds or other illnesses that may cause fever. The vaccine is needed every year. What side effects may I notice from receiving this medicine? Side effects that you should report to your doctor or health care professional as soon as possible: -allergic reactions like skin rash, itching or hives, swelling of the face, lips, or tongue Side effects that usually do not require medical attention (report to your doctor or health care professional if they continue or are bothersome): -fever -headache -muscle aches and pains -pain, tenderness, redness, or swelling at site where injected -weak or tired This list may not describe all possible side effects. Call your doctor for medical advice about side effects. You may report side effects to FDA at 1-800-FDA-1088. Where should I keep my medicine? This vaccine is only given in a clinic, pharmacy, doctor's office, or other health care setting and will not be stored at home. NOTE: This sheet is a summary. It may not cover all possible information. If you have questions about this medicine, talk to your doctor, pharmacist, or health care provider.  2017 Elsevier/Gold Standard (2008-03-30 09:30:40)  -  Low-Sodium Eating Plan Sodium raises blood pressure and causes water to be held in the body. Getting less  sodium from food will help lower your blood pressure, reduce any swelling, and protect your heart, liver, and kidneys. We get sodium by adding salt (sodium chloride) to food. Most of our sodium comes from canned, boxed, and frozen foods. Restaurant foods, fast foods, and pizza are also very high in sodium. Even if you take medicine to lower your blood pressure or to reduce fluid in your body, getting less sodium from your food is important. What is my plan? Most people should limit their sodium intake to 2,300 mg a day. Your health care provider recommends that you limit your sodium intake to 000mg  a day. What do I need to know about this eating plan? For the low-sodium eating plan, you will follow these general guidelines:  Choose foods with a % Daily Value for sodium of less than 5% (as listed on the food label).  Use salt-free seasonings or herbs instead of table salt or sea salt.  Check with your health care provider or pharmacist before using salt substitutes.  Eat fresh foods.  Eat more vegetables and fruits.  Limit canned vegetables. If you do use them, rinse them well to decrease the sodium.  Limit cheese to 1 oz (28 g) per day.  Eat lower-sodium products, often labeled as "lower sodium" or "no salt added."  Avoid foods that contain monosodium glutamate (MSG). MSG is sometimes added to [place] food and some canned foods.  Check food labels (Nutrition Facts labels) on foods to learn how much sodium is in one serving.  Eat more home-cooked food and less restaurant, buffet, and fast food.  When eating at a restaurant, ask that your food be prepared with less salt, or no salt if possible. How do I read food labels for sodium information? The Nutrition Facts label lists the amount of sodium in one serving of the food. If you eat more than one serving, you must multiply the listed amount of sodium by the number of servings. Food labels may also identify foods as:  Sodium  free-Less than 5 mg in a serving.  Very low sodium-35 mg or less in a serving.  Low sodium-140 mg or less in a serving.  Light in sodium-50% less sodium in a serving. For example, if a food that usually has 300 mg of sodium is changed to become light in sodium, it will have 150 mg of sodium.  Reduced sodium-25% less sodium in a serving. For example, if a food that usually has 400 mg of sodium is changed to reduced sodium, it will have 300 mg of sodium. What foods can I eat? Grains  Low-sodium cereals, including oats, puffed wheat and rice, and shredded wheat cereals. Low-sodium crackers. Unsalted rice and pasta. Lower-sodium bread. Vegetables  Frozen or fresh vegetables. Low-sodium or reduced-sodium canned vegetables. Low-sodium or reduced-sodium tomato sauce and paste. Low-sodium or reduced-sodium tomato and vegetable juices. Fruits  Fresh, frozen, and canned fruit. Fruit juice. Meat and Other Protein Products  Low-sodium canned tuna and salmon. Fresh or frozen meat, poultry, seafood, and fish. Lamb. Unsalted nuts. Dried beans, peas, and lentils without added salt. Unsalted canned beans. Homemade soups without salt. Eggs. Dairy  Milk. Soy milk. Ricotta cheese. Low-sodium or reduced-sodium cheeses. Yogurt. Condiments  Fresh and dried herbs and spices. Salt-free seasonings. Onion and garlic powders. Low-sodium varieties of mustard and ketchup. Fresh or  refrigerated horseradish. Lemon juice. Fats and Oils  Reduced-sodium salad dressings. Unsalted butter. Other  Unsalted popcorn and pretzels. The items listed above may not be a complete list of recommended foods or beverages. Contact your dietitian for more options.  What foods are not recommended? Grains  Instant hot cereals. Bread stuffing, pancake, and biscuit mixes. Croutons. Seasoned rice or pasta mixes. Noodle soup cups. Boxed or frozen macaroni and cheese. Self-rising flour. Regular salted crackers. Vegetables  Regular canned  vegetables. Regular canned tomato sauce and paste. Regular tomato and vegetable juices. Frozen vegetables in sauces. Salted [place] fries. Olives. Rosita Fire. Relishes. Sauerkraut. Salsa. Meat and Other Protein Products  Salted, canned, smoked, spiced, or pickled meats, seafood, or fish. Bacon, ham, sausage, hot dogs, corned beef, chipped beef, and packaged luncheon meats. Salt pork. Jerky. Pickled herring. Anchovies, regular canned tuna, and sardines. Salted nuts. Dairy  Processed cheese and cheese spreads. Cheese curds. Blue cheese and cottage cheese. Buttermilk. Condiments  Onion and garlic salt, seasoned salt, table salt, and sea salt. Canned and packaged gravies. Worcestershire sauce. Tartar sauce. Barbecue sauce. Teriyaki sauce. Soy sauce, including reduced sodium. Steak sauce. Fish sauce. Oyster sauce. Cocktail sauce. Horseradish that you find on the shelf. Regular ketchup and mustard. Meat flavorings and tenderizers. Bouillon cubes. Hot sauce. Tabasco sauce. Marinades. Taco seasonings. Relishes. Fats and Oils  Regular salad dressings. Salted butter. Margarine. Ghee. Bacon fat. Other  Potato and tortilla chips. Corn chips and puffs. Salted popcorn and pretzels. Canned or dried soups. Pizza. Frozen entrees and pot pies. The items listed above may not be a complete list of foods and beverages to avoid. Contact your dietitian for more information.  This information is not intended to replace advice given to you by your health care provider. Make sure you discuss any questions you have with your health care provider. Document Released: 02/22/2002 Document Revised: 02/08/2016 Document Reviewed: 07/07/2013 Elsevier Interactive Patient Education  2017 Elsevier Inc.    -  Hypertension Hypertension is another name for high blood pressure. High blood pressure forces your heart to work harder to pump blood. A blood pressure reading has two numbers, which includes a higher number over a lower number  (example: 110/72). Follow these instructions at home:  Have your blood pressure rechecked by your doctor.  Only take medicine as told by your doctor. Follow the directions carefully. The medicine does not work as well if you skip doses. Skipping doses also puts you at risk for problems.  Do not smoke.  Monitor your blood pressure at home as told by your doctor. Contact a doctor if:  You think you are having a reaction to the medicine you are taking.  You have repeat headaches or feel dizzy.  You have puffiness (swelling) in your ankles.  You have trouble with your vision. Get help right away if:  You get a very bad headache and are confused.  You feel weak, numb, or faint.  You get chest or belly (abdominal) pain.  You throw up (vomit).  You cannot breathe very well. This information is not intended to replace advice given to you by your health care provider. Make sure you discuss any questions you have with your health care provider. Document Released: 02/19/2008 Document Revised: 02/08/2016 Document Reviewed: 06/25/2013 Elsevier Interactive Patient Education  2017 Elsevier Inc.   - QUICK START PATIENT GUIDE TO LCHF/IF LOW CARB HIGH FAT / INTERMITTENT FASTING  Recommend: <50 gram carbohydrate a day for weightloss.  What is this diet and how  does it work? o Insulin is a hormone made by your body that allows you to use sugar (glucose) from carbohydrates in the food you eat for energy or to store glucose (as fat) for future use  o Insulin levels need to be lowered in order to utilize our stored energy (fat) o Many struggling with obesity are insulin resistant and have high levels of insulin o This diet works to lower your insulin in two ways o Fasting - allows your insulin levels to naturally decrease  o Avoiding carbohydrates - carbs trigger increase in insulin Low Carb Healthy Fat (LCHF) o Get a free app for your phone, such as MyFitnessPal, to help you track your  macronutrients (carbs/protein/fats) and to track your weight and body measurements to see your progress o Set your goal for around 10% carbs/20% protein/70% fat o A good starting goal for amount of net carbs per day is 50 grams (some will aim for 20 grams) o "Net carbs" refers to total grams of carbs minus grams of fiber (as fiber is not typically absorbed). For example, if a food has 5g total carb and 3g fiber, that would be 2g net carbs o Increase healthy fats - eg. olive oil, eggs, nuts, avocado, cheese, butter, coconut, meats, fish o Avoid high carb foods - eg. bread, pasta, potatoes, rice, cookies, soda, juice, anything sugary o Buy full-fat ingredients (avoid low-fat versions, which often have more sugar) o No need to count calories, but pay close attention to grams of carbs on labels Intermittent Fasting (IF) o "Fasting" is going a period of time without eating - it helps to stay busy and well-hydrated o Purpose of fasting is to allow insulin levels to drop as low as possible, allowing your body to switch into fat-burning mode o With this diet there are many approaches to fasting, but 16:8 and 24hr fasts are commonly used o 16:8 fast, usually 5-7 days a week - Fasting for 16 hours of the day, then eating all meals for the day over course of 8 hours. o 24 hour fast, usually 1-3 days a week - Typically eating one meal a day, then fasting until the next day. Plenty of fluids (and some salt to help you hold onto fluids) are recommended during longer fasts.  o During fasts certain beverages are still acceptable - water, sparkling water, bone broth, black tea or coffee, or tea/coffee with small amount of heavy whipping cream Special note for those on diabetic medications o Discuss your medications with your physician. You may need to hold your medication or adjust to only taking when eating. Diabetics should keep close track of their blood sugars when making any changes to diet/meds, to ensure they  are staying within normal limits For more info about LCHF/IF o Watch "Therapeutic Fasting - Solving the Two-Compartment Problem" video by Dr. Wylene Simmer on YouTube ([email or web address]) for a great intro to these concepts o Read "The Obesity Code" and/or "The Complete Guide to Fasting" by Dr. Wylene Simmer o Go to www.dietdoctor.com for explanations, recipes, and infographics about foods to eat/avoid o Get a Free smartphone app that helps count carbohydrates  - ie MyKeto EXAMPLES TO GET STARTED Fasting Beverages -water (can add  tsp Pink Himalayan salt once or twice a day to help stay hydrated for longer fasts) -Sparkling water (such as [employer] or similar; avoid any with artificial sweeteners)  -Bone broth (multiple recipes available online or can buy pre-made) -Tea or Coffee (Adding heavy whipping cream or  coconut oil to your tea or coffee can be helpful if you find yourself getting too hungry during the fasts. Can also add cinnamon for flavor. Or "bulletproof coffee.") Low Carb Healthy Fat Breakfast (if not fasting) -eggs in butter or olive oil with avocado -omelet with veggies and cheese  Lunch -hamburger with cheese and avocado wrapped in lettuce (no bun, no ketchup) -meat and cheese wrapped in lettuce (can dip in mustard or olive oil/vinegar/mayo) -salad with meat/cheese/nuts and higher fat dressing (vinaigrette or Ranch, etc) -tuna salad lettuce wrap -taco meat with cheese, sour cream, guacamole, cheese over lettuce  Dinner -steak with herb butter or Barnaise sauce -"Fathead" pizza (uses cheese and almond flour for the dough - several recipes available online) -roasted or grilled chicken with skin on, with low carb sauce (buffalo, garlic butter, alfredo, pesto, etc) -baked salmon with lemon butter -chicken alfredo with zucchini noodles -[place] butter chicken with low carb garlic naan -egg roll in a bowl  Side Dishes -mashed cauliflower (homemade or  available in freezer section) -roast vegetables (green veggies that grow above ground rather than root veggies) with butter or cheese -Caprese salad (fresh mozzarella, tomato and basil with olive oil) -homemade low-carb coleslaw Snacks/Desserts (try to avoid unnecessary snacking and sweets in general) -celery or cucumber dipped in guacamole or sour cream dip -cheese and meat slices  -raspberries with whipped cream (can make homemade with no sugar added) -low carb [place] butter cake  AVOID - sugar, diet/regular soda, potatoes, breads, rice, pasta, candy, cookies, cakes, muffins, juice, high carb fruit (bananas, grapes), beer, ketchup, barbeque and other sweet saucesPneumococcal Polysaccharide Vaccine: What You Need to Know 1. Why get vaccinated? Vaccination can protect older adults (and some children and younger adults) from pneumococcal disease. Pneumococcal disease is caused by bacteria that can spread from person to person through close contact. It can cause ear infections, and it can also lead to more serious infections of the:  Lungs (pneumonia),  Blood (bacteremia), and  Covering of the brain and spinal cord (meningitis). Meningitis can cause deafness and brain damage, and it can be fatal. Anyone can get pneumococcal disease, but children under 63 years of age, people with certain medical conditions, adults over 32 years of age, and cigarette smokers are at the highest risk. About 18,000 older adults die each year from pneumococcal disease in the [place]. Treatment of pneumococcal infections with penicillin and other drugs used to be more effective. But some strains of the disease have become resistant to these drugs. This makes prevention of the disease, through vaccination, even more important. 2. Pneumococcal polysaccharide vaccine (PPSV23) Pneumococcal polysaccharide vaccine (PPSV23) protects against 23 types of pneumococcal bacteria. It will not prevent all pneumococcal  disease. PPSV23 is recommended for:  All adults 41 years of age and older,  Anyone 2 through 45 years of age with certain long-term health problems,  Anyone 2 through 45 years of age with a weakened immune system,  Adults 71 through 45 years of age who smoke cigarettes or have asthma. Most people need only one dose of PPSV. A second dose is recommended for certain high-risk groups. People 62 and older should get a dose even if they have gotten one or more doses of the vaccine before they turned 65. Your healthcare provider can give you more information about these recommendations. Most healthy adults develop protection within 2 to 3 weeks of getting the shot. 3. Some people should not get this vaccine  Anyone who has had a life-threatening  allergic reaction to PPSV should not get another dose.  Anyone who has a severe allergy to any component of PPSV should not receive it. Tell your provider if you have any severe allergies.  Anyone who is moderately or severely ill when the shot is scheduled may be asked to wait until they recover before getting the vaccine. Someone with a mild illness can usually be vaccinated.  Children less than 53 years of age should not receive this vaccine.  There is no evidence that PPSV is harmful to either a pregnant woman or to her fetus. However, as a precaution, women who need the vaccine should be vaccinated before becoming pregnant, if possible. 4. Risks of a vaccine reaction With any medicine, including vaccines, there is a chance of side effects. These are usually mild and go away on their own, but serious reactions are also possible. About half of people who get PPSV have mild side effects, such as redness or pain where the shot is given, which go away within about two days. Less than 1 out of 100 people develop a fever, muscle aches, or more severe local reactions. Problems that could happen after any vaccine:  People sometimes faint after a medical  procedure, including vaccination. Sitting or lying down for about 15 minutes can help prevent fainting, and injuries caused by a fall. Tell your doctor if you feel dizzy, or have vision changes or ringing in the ears.  Some people get severe pain in the shoulder and have difficulty moving the arm where a shot was given. This happens very rarely.  Any medication can cause a severe allergic reaction. Such reactions from a vaccine are very rare, estimated at about 1 in a million doses, and would happen within a few minutes to a few hours after the vaccination. As with any medicine, there is a very remote chance of a vaccine causing a serious injury or death. The safety of vaccines is always being monitored. For more information, visit: http://floyd.org/ 5. What if there is a serious reaction? What should I look for? Look for anything that concerns you, such as signs of a severe allergic reaction, very high fever, or unusual behavior. Signs of a severe allergic reaction can include hives, swelling of the face and throat, difficulty breathing, a fast heartbeat, dizziness, and weakness. These would usually start a few minutes to a few hours after the vaccination. What should I do? If you think it is a severe allergic reaction or other emergency that can't wait, call 9-1-1 or get to the nearest hospital. Otherwise, call your doctor. Afterward, the reaction should be reported to the Vaccine Adverse Event Reporting System (VAERS). Your doctor might file this report, or you can do it yourself through the VAERS web site at www.vaers.LAgents.no, or by calling 1-929-800-7974. VAERS does not give medical advice. 6. How can I learn more?  Ask your doctor. He or she can give you the vaccine package insert or suggest other sources of information.  Call your local or state health department.  Contact the Centers for Disease Control and Prevention (CDC):  Call 573-553-5255 (1-800-CDC-INFO) or  Visit  CDC's website at PicCapture.uy CDC Pneumococcal Polysaccharide Vaccine VIS (01/07/14) This information is not intended to replace advice given to you by your health care provider. Make sure you discuss any questions you have with your health care provider. Document Released: 06/30/2006 Document Revised: 05/23/2016 Document Reviewed: 05/23/2016 Elsevier Interactive Patient Education  2017 [employer].

## 2016-10-09 ENCOUNTER — Telehealth: Payer: Self-pay

## 2016-10-09 ENCOUNTER — Other Ambulatory Visit: Payer: Self-pay | Admitting: Internal Medicine

## 2016-10-09 ENCOUNTER — Telehealth: Payer: Self-pay | Admitting: Internal Medicine

## 2016-10-09 LAB — VITAMIN D 25 HYDROXY (VIT D DEFICIENCY, FRACTURES): VIT D 25 HYDROXY: 12 ng/mL — AB (ref 30–100)

## 2016-10-09 MED ORDER — VITAMIN D (ERGOCALCIFEROL) 1.25 MG (50000 UNIT) PO CAPS: 50000.0000 [IU] | ORAL_CAPSULE | ORAL | 0 refills | Status: DC

## 2016-10-09 NOTE — Telephone Encounter (Signed)
Patient returned nurse's call regarding her results. Please follow up. Pt gave the verbal ok to leave a detailed message on phone.   Thank you.

## 2016-10-09 NOTE — Telephone Encounter (Signed)
Contacted pt to go over lab results pt didn't answer lvm asking pt to give me a call back at her earliest convenience  

## 2016-10-10 ENCOUNTER — Telehealth: Payer: Self-pay

## 2016-10-10 NOTE — Telephone Encounter (Signed)
Returned pt call and left a detailed vm regarding lab results and if she has any questions or concerns to give me a call

## 2016-10-10 NOTE — Telephone Encounter (Signed)
Returned pt call to go over lab results pt didn't answer left a detailed message regarding her lab results and if she has questions or concerns to give me a call

## 2016-11-19 ENCOUNTER — Other Ambulatory Visit: Payer: BLUE CROSS/BLUE SHIELD | Admitting: Internal Medicine

## 2017-01-30 ENCOUNTER — Encounter: Payer: Self-pay | Admitting: Internal Medicine

## 2017-01-31 ENCOUNTER — Encounter: Payer: Self-pay | Admitting: Internal Medicine

## 2017-02-03 ENCOUNTER — Encounter: Payer: Self-pay | Admitting: Internal Medicine

## 2017-04-03 ENCOUNTER — Ambulatory Visit: Payer: BLUE CROSS/BLUE SHIELD | Admitting: Internal Medicine

## 2017-05-27 ENCOUNTER — Ambulatory Visit: Payer: BLUE CROSS/BLUE SHIELD | Admitting: Internal Medicine

## 2017-07-11 ENCOUNTER — Ambulatory Visit: Payer: BLUE CROSS/BLUE SHIELD | Admitting: Internal Medicine

## 2022-06-04 ENCOUNTER — Other Ambulatory Visit: Payer: Self-pay | Admitting: Pharmacist

## 2022-06-04 ENCOUNTER — Other Ambulatory Visit: Payer: Self-pay

## 2022-06-04 ENCOUNTER — Other Ambulatory Visit: Payer: Self-pay | Admitting: Nurse Practitioner

## 2022-06-04 ENCOUNTER — Encounter: Payer: Self-pay | Admitting: Nurse Practitioner

## 2022-06-04 ENCOUNTER — Other Ambulatory Visit (HOSPITAL_COMMUNITY)
Admission: RE | Admit: 2022-06-04 | Discharge: 2022-06-04 | Disposition: A | Discharge status: Home / Self Care | Payer: Commercial Managed Care - HMO | Source: Ambulatory Visit | Attending: Nurse Practitioner | Admitting: Nurse Practitioner

## 2022-06-04 ENCOUNTER — Ambulatory Visit: Payer: Commercial Managed Care - HMO | Attending: Nurse Practitioner | Admitting: Nurse Practitioner

## 2022-06-04 VITALS — BP 191/115 | HR 75 | Temp 98.0°F | Ht 64.0 in | Wt 229.6 lb

## 2022-06-04 DIAGNOSIS — N760 Acute vaginitis: Secondary | ICD-10-CM

## 2022-06-04 DIAGNOSIS — I10 Essential (primary) hypertension: Secondary | ICD-10-CM

## 2022-06-04 DIAGNOSIS — Z1159 Encounter for screening for other viral diseases: Secondary | ICD-10-CM

## 2022-06-04 DIAGNOSIS — Z1231 Encounter for screening mammogram for malignant neoplasm of breast: Secondary | ICD-10-CM

## 2022-06-04 DIAGNOSIS — Z114 Encounter for screening for human immunodeficiency virus [HIV]: Secondary | ICD-10-CM

## 2022-06-04 DIAGNOSIS — F17200 Nicotine dependence, unspecified, uncomplicated: Secondary | ICD-10-CM

## 2022-06-04 DIAGNOSIS — Z23 Encounter for immunization: Secondary | ICD-10-CM | POA: Diagnosis not present

## 2022-06-04 DIAGNOSIS — Z6839 Body mass index (BMI) 39.0-39.9, adult: Secondary | ICD-10-CM

## 2022-06-04 DIAGNOSIS — E559 Vitamin D deficiency, unspecified: Secondary | ICD-10-CM | POA: Diagnosis not present

## 2022-06-04 DIAGNOSIS — F17210 Nicotine dependence, cigarettes, uncomplicated: Secondary | ICD-10-CM

## 2022-06-04 DIAGNOSIS — G4733 Obstructive sleep apnea (adult) (pediatric): Secondary | ICD-10-CM

## 2022-06-04 DIAGNOSIS — E785 Hyperlipidemia, unspecified: Secondary | ICD-10-CM

## 2022-06-04 DIAGNOSIS — E669 Obesity, unspecified: Secondary | ICD-10-CM

## 2022-06-04 DIAGNOSIS — Z7689 Persons encountering health services in other specified circumstances: Secondary | ICD-10-CM

## 2022-06-04 LAB — POCT URINALYSIS DIP (CLINITEK)
Blood, UA: NEGATIVE
Glucose, UA: NEGATIVE mg/dL
Ketones, POC UA: NEGATIVE mg/dL
Leukocytes, UA: NEGATIVE
Nitrite, UA: NEGATIVE
POC PROTEIN,UA: 100 — AB
Spec Grav, UA: >=1.03 — AB (ref 1.010–1.025)
Urobilinogen, UA: 1 E.U./dL
pH, UA: 5.5 (ref 5.0–8.0)

## 2022-06-04 MED ORDER — ALBUTEROL SULFATE HFA 108 (90 BASE) MCG/ACT IN AERS
1.0000 | INHALATION_SPRAY | Freq: Four times a day (QID) | RESPIRATORY_TRACT | 2 refills | Status: DC | PRN
  Filled 2022-06-04: qty 6.7, 25d supply, fill #0

## 2022-06-04 MED ORDER — CLONIDINE HCL 0.2 MG PO TABS: 0.2000 mg | ORAL_TABLET | Freq: Once | ORAL | Status: AC

## 2022-06-04 MED ORDER — ALBUTEROL SULFATE HFA 108 (90 BASE) MCG/ACT IN AERS
1.0000 | INHALATION_SPRAY | Freq: Four times a day (QID) | RESPIRATORY_TRACT | 1 refills | Status: DC | PRN
  Filled 2022-06-04: qty 8, fill #0

## 2022-06-04 MED ORDER — VITAMIN D (ERGOCALCIFEROL) 1.25 MG (50000 UNIT) PO CAPS
50000.0000 [IU] | ORAL_CAPSULE | ORAL | 0 refills | Status: AC
  Filled 2022-06-04: qty 4, 28d supply, fill #0

## 2022-06-04 MED ORDER — VALSARTAN 160 MG PO TABS
160.0000 mg | ORAL_TABLET | Freq: Every day | ORAL | 3 refills | Status: AC
  Filled 2022-06-04: qty 30, 30d supply, fill #0
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
## 2022-06-05 LAB — LIPID PANEL
Chol/HDL Ratio: 2.2 ratio (ref 0.0–4.4)
Cholesterol, Total: 237 mg/dL — ABNORMAL HIGH (ref 100–199)
HDL: 110 mg/dL (ref >39)
LDL Chol Calc (NIH): 94 mg/dL (ref 0–99)
Triglycerides: 203 mg/dL — ABNORMAL HIGH (ref 0–149)
VLDL Cholesterol Cal: 33 mg/dL (ref 5–40)

## 2022-06-05 LAB — CERVICOVAGINAL ANCILLARY ONLY
Bacterial Vaginitis (gardnerella): POSITIVE — AB
Candida Glabrata: NEGATIVE
Candida Vaginitis: NEGATIVE
Chlamydia: NEGATIVE
Comment: NEGATIVE
Comment: NEGATIVE
Comment: NEGATIVE
Comment: NEGATIVE
Comment: NEGATIVE
Comment: NORMAL
Neisseria Gonorrhea: NEGATIVE
Trichomonas: NEGATIVE

## 2022-06-05 LAB — CBC WITH DIFFERENTIAL/PLATELET
Basophils Absolute: 0 10*3/uL (ref 0.0–0.2)
Basos: 0 %
EOS (ABSOLUTE): 0.1 10*3/uL (ref 0.0–0.4)
Eos: 2 %
Hematocrit: 38.2 % (ref 34.0–46.6)
Hemoglobin: 13.1 g/dL (ref 11.1–15.9)
Immature Grans (Abs): 0 10*3/uL (ref 0.0–0.1)
Immature Granulocytes: 0 %
Lymphocytes Absolute: 1.4 10*3/uL (ref 0.7–3.1)
Lymphs: 29 %
MCH: 31 pg (ref 26.6–33.0)
MCHC: 34.3 g/dL (ref 31.5–35.7)
MCV: 91 fL (ref 79–97)
Monocytes Absolute: 0.3 10*3/uL (ref 0.1–0.9)
Monocytes: 6 %
Neutrophils Absolute: 3 10*3/uL (ref 1.4–7.0)
Neutrophils: 63 %
Platelets: 230 10*3/uL (ref 150–450)
RBC: 4.22 x10E6/uL (ref 3.77–5.28)
RDW: 12.4 % (ref 11.7–15.4)
WBC: 4.8 10*3/uL (ref 3.4–10.8)

## 2022-06-05 LAB — CMP14+EGFR
ALT: 12 IU/L (ref 0–32)
AST: 15 IU/L (ref 0–40)
Albumin/Globulin Ratio: 2 (ref 1.2–2.2)
Albumin: 4.5 g/dL (ref 3.9–4.9)
Alkaline Phosphatase: 62 IU/L (ref 44–121)
BUN/Creatinine Ratio: 16 (ref 9–23)
BUN: 19 mg/dL (ref 6–24)
Bilirubin Total: 0.3 mg/dL (ref 0.0–1.2)
CO2: 21 mmol/L (ref 20–29)
Calcium: 9.1 mg/dL (ref 8.7–10.2)
Chloride: 102 mmol/L (ref 96–106)
Creatinine, Ser: 1.19 mg/dL — ABNORMAL HIGH (ref 0.57–1.00)
Globulin, Total: 2.3 g/dL (ref 1.5–4.5)
Glucose: 142 mg/dL — ABNORMAL HIGH (ref 70–99)
Potassium: 3.9 mmol/L (ref 3.5–5.2)
Sodium: 142 mmol/L (ref 134–144)
Total Protein: 6.8 g/dL (ref 6.0–8.5)
eGFR: 56 mL/min/{1.73_m2} — ABNORMAL LOW (ref >59)

## 2022-06-05 LAB — HCV AB W REFLEX TO QUANT PCR: HCV Ab: NONREACTIVE

## 2022-06-05 LAB — HIV ANTIBODY (ROUTINE TESTING W REFLEX): HIV Screen 4th Generation wRfx: NONREACTIVE

## 2022-06-05 LAB — VITAMIN D 25 HYDROXY (VIT D DEFICIENCY, FRACTURES): Vit D, 25-Hydroxy: 15.2 ng/mL — ABNORMAL LOW (ref 30.0–100.0)

## 2022-06-05 LAB — HCV INTERPRETATION

## 2022-06-05 LAB — THYROID PANEL WITH TSH
Free Thyroxine Index: 2 (ref 1.2–4.9)
T3 Uptake Ratio: 29 % (ref 24–39)
T4, Total: 6.8 ug/dL (ref 4.5–12.0)
TSH: 1.17 u[IU]/mL (ref 0.450–4.500)

## 2022-06-07 ENCOUNTER — Other Ambulatory Visit: Payer: Self-pay

## 2022-06-10 ENCOUNTER — Other Ambulatory Visit: Payer: Self-pay | Admitting: Nurse Practitioner

## 2022-06-10 MED ORDER — METRONIDAZOLE 500 MG PO TABS: 500.0000 mg | ORAL_TABLET | Freq: Two times a day (BID) | ORAL | 0 refills | Status: AC

## 2022-06-11 ENCOUNTER — Other Ambulatory Visit: Payer: Self-pay

## 2022-06-18 ENCOUNTER — Other Ambulatory Visit: Payer: Self-pay

## 2022-07-11 ENCOUNTER — Ambulatory Visit: Payer: BLUE CROSS/BLUE SHIELD | Admitting: Pharmacist

## 2022-07-23 ENCOUNTER — Ambulatory Visit: Payer: BLUE CROSS/BLUE SHIELD

## 2022-08-14 NOTE — Progress Notes (Deleted)
   S:     PCP: Tonya Vaughn  51 y.o. female who presents for hypertension evaluation, education, and management.  PMH is significant for GERD, obesity, depression, HTN.  Patient was referred and last seen by Primary Care Provider, NP Tonya Vaughn, on 06/04/2022.   At last visit, BP was elevated at 191/115. She states that she has not taken any blood pressure medications in years. She received 0.2mg  of clonidine in office and was started on valsartan 160mg  once daily.   Today, patient arrives in *** spirits and presents without *** assistance. *** Denies dizziness, headache, blurred vision, swelling.   Patient reports hypertension was diagnosed in ***.   Family/Social history:  -Fhx: none on file -Tobacco: *** -Alcohol: ***  Medication adherence *** . Patient has *** taken BP medications today.   Current antihypertensives include: valsartan 160mg  once daily  Antihypertensives tried in the past include: lisinopril (cough)  Reported home BP readings: ***  Patient reported dietary habits: Eats *** meals/day Breakfast: *** Lunch: *** Dinner: *** Snacks: *** Drinks: ***  Patient-reported exercise habits: ***  ASCVD risk factors include: ***  O:  ROS  Physical Exam  Last 3 Office BP readings: BP Readings from Last 3 Encounters:  06/04/22 (!) 191/115  10/08/16 (!) 185/110  08/26/16 163/97    BMET    Component Value Date/Time   NA 142 06/04/2022 1429   K 3.9 06/04/2022 1429   CL 102 06/04/2022 1429   CO2 21 06/04/2022 1429   GLUCOSE 142 (H) 06/04/2022 1429   GLUCOSE 115 (H) 10/08/2016 0956   BUN 19 06/04/2022 1429   CREATININE 1.19 (H) 06/04/2022 1429   CREATININE 1.17 (H) 10/08/2016 0956   CALCIUM 9.1 06/04/2022 1429   GFRNONAA 56 (L) 10/08/2016 0956   GFRAA 65 10/08/2016 0956    Renal function: CrCl cannot be calculated (Patient's most recent lab result is older than the maximum 21 days allowed.).  Clinical ASCVD: {YES/NO:21197} The ASCVD Risk score  (Arnett DK, et al., 2019) failed to calculate for the following reasons:   The valid HDL cholesterol range is 20 to 100 mg/dL  Patient is participating in a Managed Medicaid Plan:  {MM YES/NO:27447::"Yes"}    A/P: Hypertension diagnosed *** currently *** on current medications. BP goal < 130/80 *** mmHg. Medication adherence appears ***. Control is suboptimal due to ***.  -{Meds adjust:18428} ***.  -Patient educated on purpose, proper use, and potential adverse effects of ***.  -F/u labs ordered - *** -Counseled on lifestyle modifications for blood pressure control including reduced dietary sodium, increased exercise, adequate sleep. -Encouraged patient to check BP at home and bring log of readings to next visit. Counseled on proper use of home BP cuff.    Results reviewed and written information provided.    Written patient instructions provided. Patient verbalized understanding of treatment plan.  Total time in face to face counseling *** minutes.    Follow-up:  Pharmacist ***. PCP clinic visit in 09/04/2022  2020, PharmD PGY-1 [facility] Resident

## 2022-08-15 ENCOUNTER — Ambulatory Visit: Payer: BLUE CROSS/BLUE SHIELD | Admitting: Pharmacist

## 2022-09-03 ENCOUNTER — Encounter (HOSPITAL_COMMUNITY): Payer: Self-pay

## 2022-09-03 ENCOUNTER — Ambulatory Visit: Payer: BLUE CROSS/BLUE SHIELD | Admitting: Nurse Practitioner

## 2022-09-03 ENCOUNTER — Other Ambulatory Visit: Payer: Self-pay

## 2022-09-03 ENCOUNTER — Emergency Department (HOSPITAL_COMMUNITY)
Admission: EM | Admit: 2022-09-03 | Discharge: 2022-09-04 | Discharge status: Against Medical Advice | Payer: Commercial Managed Care - HMO | Attending: Emergency Medicine | Admitting: Emergency Medicine

## 2022-09-03 ENCOUNTER — Emergency Department (HOSPITAL_COMMUNITY): Payer: Commercial Managed Care - HMO

## 2022-09-03 DIAGNOSIS — Z1152 Encounter for screening for COVID-19: Secondary | ICD-10-CM | POA: Diagnosis not present

## 2022-09-03 DIAGNOSIS — Z5321 Procedure and treatment not carried out due to patient leaving prior to being seen by health care provider: Secondary | ICD-10-CM | POA: Insufficient documentation

## 2022-09-03 DIAGNOSIS — I10 Essential (primary) hypertension: Secondary | ICD-10-CM | POA: Diagnosis present

## 2022-09-03 LAB — BASIC METABOLIC PANEL
Anion gap: 10 (ref 5–15)
BUN: 18 mg/dL (ref 6–20)
CO2: 23 mmol/L (ref 22–32)
Calcium: 9.2 mg/dL (ref 8.9–10.3)
Chloride: 103 mmol/L (ref 98–111)
Creatinine, Ser: 1.5 mg/dL — ABNORMAL HIGH (ref 0.44–1.00)
GFR, Estimated: 42 mL/min — ABNORMAL LOW (ref >60)
Glucose, Bld: 100 mg/dL — ABNORMAL HIGH (ref 70–99)
Potassium: 3.7 mmol/L (ref 3.5–5.1)
Sodium: 136 mmol/L (ref 135–145)

## 2022-09-03 LAB — CBC
HCT: 40.3 % (ref 36.0–46.0)
Hemoglobin: 13.2 g/dL (ref 12.0–15.0)
MCH: 30.8 pg (ref 26.0–34.0)
MCHC: 32.8 g/dL (ref 30.0–36.0)
MCV: 94.2 fL (ref 80.0–100.0)
Platelets: 209 10*3/uL (ref 150–400)
RBC: 4.28 MIL/uL (ref 3.87–5.11)
RDW: 12.7 % (ref 11.5–15.5)
WBC: 4.5 10*3/uL (ref 4.0–10.5)
nRBC: 0 % (ref 0.0–0.2)

## 2022-09-03 LAB — RESP PANEL BY RT-PCR (RSV, FLU A&B, COVID)  RVPGX2
Influenza A by PCR: NEGATIVE
Influenza B by PCR: NEGATIVE
Resp Syncytial Virus by PCR: POSITIVE — AB
SARS Coronavirus 2 by RT PCR: NEGATIVE

## 2022-09-03 MED ORDER — ALBUTEROL SULFATE HFA 108 (90 BASE) MCG/ACT IN AERS: 1.0000 | INHALATION_SPRAY | RESPIRATORY_TRACT | Status: DC | PRN

## 2022-09-03 MED ORDER — IRBESARTAN 150 MG PO TABS
150.0000 mg | ORAL_TABLET | Freq: Every day | ORAL | Status: DC
  Filled 2022-09-03: qty 1

## 2022-09-03 NOTE — ED Provider Triage Note (Signed)
Emergency Medicine Provider Triage Evaluation Note  Tonya Vaughn , a 51 y.o. female  was evaluated in triage.  Pt complains of high blood pressure.  Patient states that she went to her primary care today with concerns of respiratory illness.  Patient states that she has been with cough, nasal congestion, sore throat for the past 3 days.  She states that over the course that time she has not taken her at home blood pressure medications.  She had an at home negative COVID test.  Was sent here by PCP for testing of renal function given patient's elevated blood pressure.  Denies chest pain, dizziness, headache, shortness of breath, abdominal pain, nausea, vomiting..  Review of Systems  Positive: See above Negative:   Physical Exam  BP (!) 221/122 (BP Location: Left Arm)   Pulse 82   Temp 97.9 F (36.6 C) (Oral)   Resp 18   SpO2 100%  Gen:   Awake, no distress   Resp:  Normal effort  MSK:   Moves extremities without difficulty  Other:  Mild diffuse wheeze/rhonchi auscultated in bilateral lung fields.  Medical Decision Making  Medically screening exam initiated at 7:27 PM.  Appropriate orders placed.  Tonya Vaughn was informed that the remainder of the evaluation will be completed by another provider, this initial triage assessment does not replace that evaluation, and the importance of remaining in the ED until their evaluation is complete.     Peter Garter, [place] 09/03/22 1930

## 2022-09-03 NOTE — ED Triage Notes (Signed)
Pt states that she went to her doctors today and her bp was elevated. Pt is currently taking Valsartan daily for her bp.

## 2022-09-04 ENCOUNTER — Telehealth: Payer: Self-pay | Admitting: Emergency Medicine

## 2022-09-04 ENCOUNTER — Ambulatory Visit: Payer: BLUE CROSS/BLUE SHIELD | Admitting: Physician Assistant

## 2022-09-04 ENCOUNTER — Ambulatory Visit: Payer: Self-pay

## 2022-09-04 ENCOUNTER — Encounter (INDEPENDENT_AMBULATORY_CARE_PROVIDER_SITE_OTHER): Payer: Self-pay | Admitting: Primary Care

## 2022-09-04 NOTE — Telephone Encounter (Signed)
Copied from CRM [id number]. Topic: General - Other >> Sep 04, 2022  2:25 PM Turkey B wrote: Reason for CRM: Patient called in states needs Dr's note for work from todays date, 12/20 until 12/26. She is requesting to have this put in [place]

## 2022-09-04 NOTE — Telephone Encounter (Signed)
  Chief Complaint: sinus pain Symptoms: sinus HA, congestion and cough Frequency: yesterday, HA started today Pertinent Negatives: Patient denies SOB or fever Disposition: [] ED /[] Urgent Care (no appt availability in office) / [] Appointment(In office/virtual)/ []  [facility]/ [x] Home Care/ [] Refused Recommended Disposition /[] [facility]/ []  Follow-up with PCP Additional Notes: pt went to ED yesterday, tested positive for RSV but didn't see provider at ED d/t long wait time. Pt asking what she can take. Advised to try OTC sinus medication or Tylenol/Excedrin and if no improvement or sx get worse to call back to schedule appt. Pt verbalized understanding. Also advised RSV is contagious and pt would need to quarantine for several days.   Summary: headache and congestion   The patient shares that they have tested positive for RSV yesterday 09/03/22  The patient shares that they are experiencing a headache and congestion  The patient would like to speak with a member of clinical staff when possible  The patient would like to further discuss their concerns when possible         Reason for Disposition  [1] Sinus congestion as part of a cold AND [2] present < 10 days  Answer Assessment - Initial Assessment Questions 1. LOCATION: "Where does it hurt?"      HA 2. ONSET: "When did the sinus pain start?"  (e.g., hours, days)      Today  3. SEVERITY: "How bad is the pain?"   (Scale 1-10; mild, moderate or severe)   - MILD (1-3): doesn't interfere with normal activities    - MODERATE (4-7): interferes with normal activities (e.g., work or school) or awakens from sleep   - SEVERE (8-10): excruciating pain and patient unable to do any normal activities        Moderate  5. NASAL CONGESTION: "Is the nose blocked?" If Yes, ask: "Can you open it or must you breathe through your mouth?"     yes 6. NASAL DISCHARGE: "Do you have discharge from your nose?" If so ask, "What  color?"     yes 7. FEVER: "Do you have a fever?" If Yes, ask: "What is it, how was it measured, and when did it start?"      no 8. OTHER SYMPTOMS: "Do you have any other symptoms?" (e.g., sore throat, cough, earache, difficulty breathing)     HA, congestion, cough  Protocols used: Sinus Pain or Congestion-A-AH

## 2022-09-10 ENCOUNTER — Ambulatory Visit: Payer: BLUE CROSS/BLUE SHIELD | Admitting: Nurse Practitioner

## 2022-09-20 ENCOUNTER — Ambulatory Visit: Payer: BLUE CROSS/BLUE SHIELD

## 2022-11-05 ENCOUNTER — Ambulatory Visit: Payer: BLUE CROSS/BLUE SHIELD

## 2022-12-16 ENCOUNTER — Ambulatory Visit
Admission: RE | Admit: 2022-12-16 | Discharge: 2022-12-16 | Disposition: A | Discharge status: Home / Self Care | Payer: Medicaid Other | Source: Ambulatory Visit | Attending: Nurse Practitioner

## 2022-12-16 DIAGNOSIS — Z1231 Encounter for screening mammogram for malignant neoplasm of breast: Secondary | ICD-10-CM

## 2023-01-14 ENCOUNTER — Other Ambulatory Visit (HOSPITAL_BASED_OUTPATIENT_CLINIC_OR_DEPARTMENT_OTHER): Payer: Self-pay

## 2023-01-14 DIAGNOSIS — R0683 Snoring: Secondary | ICD-10-CM

## 2023-01-14 DIAGNOSIS — R5383 Other fatigue: Secondary | ICD-10-CM

## 2023-01-14 DIAGNOSIS — G4709 Other insomnia: Secondary | ICD-10-CM

## 2023-01-16 ENCOUNTER — Ambulatory Visit (INDEPENDENT_AMBULATORY_CARE_PROVIDER_SITE_OTHER): Payer: Medicaid Other | Admitting: Sports Medicine

## 2023-01-16 ENCOUNTER — Other Ambulatory Visit: Payer: Self-pay

## 2023-01-16 ENCOUNTER — Encounter: Payer: Self-pay | Admitting: Sports Medicine

## 2023-01-16 ENCOUNTER — Other Ambulatory Visit (INDEPENDENT_AMBULATORY_CARE_PROVIDER_SITE_OTHER): Payer: Medicaid Other

## 2023-01-16 DIAGNOSIS — M25562 Pain in left knee: Secondary | ICD-10-CM | POA: Diagnosis not present

## 2023-01-16 DIAGNOSIS — Q685 Congenital bowing of long bones of leg, unspecified: Secondary | ICD-10-CM

## 2023-01-16 DIAGNOSIS — M170 Bilateral primary osteoarthritis of knee: Secondary | ICD-10-CM

## 2023-01-16 DIAGNOSIS — M25561 Pain in right knee: Secondary | ICD-10-CM | POA: Diagnosis not present

## 2023-01-16 DIAGNOSIS — G8929 Other chronic pain: Secondary | ICD-10-CM | POA: Diagnosis not present

## 2023-01-16 MED ORDER — MELOXICAM 15 MG PO TABS: 15.0000 mg | ORAL_TABLET | Freq: Every day | ORAL | 1 refills | Status: DC

## 2023-01-16 NOTE — Progress Notes (Signed)
Bilateral knee pain Left worse at night Right worse during day Denies daily OTC medication; occasional aleve Minimal swelling No injury

## 2023-01-16 NOTE — Progress Notes (Signed)
Tonya Vaughn - 52 y.o. female MRN [id number]  Date of birth: [birth year]  Office Visit Note: Visit Date: 01/16/2023 PCP: Tonya Labs, NP Referred by: Tonya Labs, NP  Subjective: Chief Complaint  Patient presents with   Right Knee - Pain   Left Knee - Pain   HPI: Tonya Vaughn is a pleasant 52 y.o. female who presents today for acute on chronic bilateral knee pain.  She has had bilateral knee pain for a few years now, recently has been worsening.  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  Will have intermittent swelling, although no significant swelling today.  Denies any inciting event or injury.  Has taken Aleve or over-the-counter pain patches for only temporary relief.  She does note she has been bowlegged since childhood.  Pertinent ROS were reviewed with the patient and found to be negative unless otherwise specified above in HPI.   Assessment & Plan: Visit Diagnoses:  1. Bilateral primary osteoarthritis of knee   2. Chronic pain of both knees   3. Congenital bowed legs    Plan: Discussed with Tonya Vaughn today that she does have rather advanced medial joint tibiofemoral osteoarthritis with essentially bone-on-bone change of the medial compartment.  This is likely exacerbated by her bowlegged formation.  Her pain has been exacerbated given her osteoarthritic change.  She has failed the home conservative measures.  We discussed all treatment options such as oral medication therapy, physical therapy versus home exercises, injection therapy.  Will proceed with meloxicam 15 mg to be taken once daily.  Referral sent for formalized physical therapy to help strengthen and support the quadriceps and surrounding structures.  I would like to see her back in about 5 to 6 weeks to see what sort of relief she has gotten.  Additional treatment options may consider corticosteroid injection.  Do not think she would benefit from medial  unloader bracing given her bowlegged.  Follow-up: Return in about 6 weeks (around 02/27/2023) for for b/l knee pain/OA.   Meds & Orders:  Meds ordered this encounter  Medications   meloxicam (MOBIC) 15 MG tablet    Sig: Take 1 tablet (15 mg total) by mouth daily.    Dispense:  30 tablet    Refill:  1    Orders Placed This Encounter  Procedures   XR Knee Complete 4 Views Right   XR Knee Complete 4 Views Left   Ambulatory referral to Physical Therapy     Procedures: No procedures performed      Clinical History: No specialty comments available.  She reports that she has been smoking cigarettes. She has been smoking an average of .5 packs per day. She has never used smokeless tobacco. No results for input(s): "HGBA1C", "LABURIC" in the last 8760 hours.  Objective:   Vital Signs: There were no vitals taken for this visit.  Physical Exam  Gen: Well-appearing, in no acute distress; non-toxic CV: Well-perfused. Warm.  Resp: Breathing unlabored on room air; no wheezing. Psych: Fluid speech in conversation; appropriate affect; normal thought process Neuro: Sensation intact throughout. No gross coordination deficits.   Ortho Exam - Bilateral knees: There is obvious varus bowlegged formation of the legs, right greater than left.  Range of motion of both knees is relatively well-preserved from 0-130 degrees.  There is pain with varus testing without laxity or giving way.  Positive TTP of bilateral medial joint line.  Patellar crepitus noted.  5/5 strength in  all directions.  Imaging: XR Knee Complete 4 Views Right  Result Date: 01/16/2023 4 views of bilateral knee including AP standing, Rosenberg, lateral and sunrise views were ordered and reviewed by myself today.  X-rays demonstrate complete collapse of the medial joint space bilaterally with severe osteoarthritic change in the medial tibiofemoral compartment as well as moderate patellofemoral arthralgia with bony spurring.  There is  some sclerosis of the medial femoral condyle bilaterally.  Lateral joint line relatively well-preserved.  No acute fracture noted.  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   Past Medical/Family/Surgical/Social History: Medications & Allergies reviewed per EMR, new medications updated. 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   Well woman exam 05/05/2011   CIGARETTE SMOKER 04/06/2010   KNEE PAIN, BILATERAL 04/06/2010   GASTROESOPHAGEAL REFLUX DISEASE, MILD 01/11/2010   OBESITY, UNSPECIFIED 12/15/2009   Sleep apnea 12/15/2009   DEPRESSION 12/14/2009   Past Medical History:  Diagnosis Date   Hypertension    Vitamin D deficiency    Family History  Problem Relation Age of Onset   Breast cancer Maternal Grandmother    Past Surgical History:  Procedure Laterality Date   TUBAL LIGATION     TUBAL LIGATION  2009   Social History   Occupational History   Not on file  Tobacco Use   Smoking status: Every Day    Packs/day: .5    Types: Cigarettes   Smokeless tobacco: Never   Tobacco comments:    pt states 'i don't smoke that much'  Substance and Sexual Activity   Alcohol use: Yes    Comment: occ   Drug use: No   Sexual activity: Not on file

## 2023-02-05 ENCOUNTER — Ambulatory Visit: Payer: Medicaid Other

## 2023-02-16 ENCOUNTER — Ambulatory Visit (HOSPITAL_BASED_OUTPATIENT_CLINIC_OR_DEPARTMENT_OTHER): Payer: Medicaid Other | Admitting: Internal Medicine

## 2023-02-18 NOTE — Therapy (Deleted)
OUTPATIENT PHYSICAL THERAPY LOWER EXTREMITY EVALUATION   Patient Name: Tonya Vaughn MRN: [id number] DOB:1971-01-25, 52 y.o., female Today's Date: 02/18/2023  END OF SESSION:   Past Medical History:  Diagnosis Date   Hypertension    Vitamin D deficiency    Past Surgical History:  Procedure Laterality Date   TUBAL LIGATION     TUBAL LIGATION  2009   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   Well woman exam 05/05/2011   CIGARETTE SMOKER 04/06/2010   KNEE PAIN, BILATERAL 04/06/2010   GASTROESOPHAGEAL REFLUX DISEASE, MILD 01/11/2010   OBESITY, UNSPECIFIED 12/15/2009   Sleep apnea 12/15/2009   DEPRESSION 12/14/2009    PCP: Harlen Labs, NP   REFERRING PROVIDER: Madelyn Brunner, DO   REFERRING DIAG: M25.561,M25.562,G89.29 (ICD-10-CM) - Chronic pain of both knees  THERAPY DIAG:  No diagnosis found.  Rationale for Evaluation and Treatment: Rehabilitation  ONSET DATE: chronic  SUBJECTIVE:   SUBJECTIVE STATEMENT: ***  PERTINENT HISTORY: MANERVIA MUSCO is a pleasant 52 y.o. female who presents today for acute on chronic bilateral knee pain.   She has had bilateral knee pain for a few years now, recently has been worsening.  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  Will have intermittent swelling, although no significant swelling today.  Denies any inciting event or injury.  Has taken Aleve or over-the-counter pain patches for only temporary relief.   She does note she has been bowlegged since childhood.   Pertinent ROS were reviewed with the patient and found to be negative unless otherwise specified above in HPI.  PAIN:  Are you having pain? {OPRCPAIN:27236}  PRECAUTIONS: None  WEIGHT BEARING RESTRICTIONS: No  FALLS:  Has patient fallen in last 6 months? No  OCCUPATION: ***  PLOF: Independent  PATIENT GOALS: To  manage my knee pain  NEXT MD VISIT: 5-6 weeks  OBJECTIVE:   DIAGNOSTIC FINDINGS: Imaging: XR Knee Complete 4 Views Right   Result Date: 01/16/2023 4 views of bilateral knee including AP standing, Rosenberg, lateral and sunrise views were ordered and reviewed by myself today.  X-rays demonstrate complete collapse of the medial joint space bilaterally with severe osteoarthritic change in the medial tibiofemoral compartment as well as moderate patellofemoral arthralgia with bony spurring.  There is some sclerosis of the medial femoral condyle bilaterally.  Lateral joint line relatively well-preserved.  No acute fracture noted.  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   PATIENT SURVEYS:  FOTO ***   MUSCLE LENGTH: Hamstrings: Right *** deg; Left *** deg Maisie Fus test: Right *** deg; Left *** deg  POSTURE: {posture:25561}  PALPATION: ***  LOWER EXTREMITY ROM:  {AROM/PROM:27142} ROM Right eval Left eval  Hip flexion    Hip extension    Hip abduction    Hip adduction    Hip internal rotation    Hip external rotation    Knee flexion    Knee extension    Ankle dorsiflexion    Ankle plantarflexion    Ankle inversion    Ankle eversion     (Blank rows = not tested)  LOWER EXTREMITY MMT:  MMT Right eval Left eval  Hip flexion    Hip extension    Hip abduction    Hip adduction    Hip internal rotation    Hip external rotation    Knee flexion    Knee extension  Ankle dorsiflexion    Ankle plantarflexion    Ankle inversion    Ankle eversion     (Blank rows = not tested)  LOWER EXTREMITY SPECIAL TESTS:  {LEspecialtests:26242}  FUNCTIONAL TESTS:  {Functional tests:24029}  GAIT: Distance walked: 78ft x2 Assistive device utilized: None Level of assistance: Complete Independence Comments: ***   TODAY'S TREATMENT:                                                                                                                              DATE: ***     PATIENT EDUCATION:  Education details: Discussed eval findings, rehab rationale and POC and patient is in agreement  Person educated: Patient Education method: Explanation Education comprehension: verbalized understanding and needs further education  HOME EXERCISE PROGRAM: ***  ASSESSMENT:  CLINICAL IMPRESSION: Patient is a *** y.o. *** who was seen today for physical therapy evaluation and treatment for ***.   OBJECTIVE IMPAIRMENTS: {opptimpairments:25111}.   ACTIVITY LIMITATIONS: {activitylimitations:27494}  PARTICIPATION LIMITATIONS: {participationrestrictions:25113}  PERSONAL FACTORS: {Personal factors:25162} are also affecting patient's functional outcome.   REHAB POTENTIAL: Fair based on chronicity and advanced degenerative changes  CLINICAL DECISION MAKING: Stable/uncomplicated  EVALUATION COMPLEXITY: Low   GOALS: Goals reviewed with patient? {yes/no:20286}  SHORT TERM GOALS: Target date: *** *** Baseline: Goal status: {GOALSTATUS:25110}  2.  *** Baseline:  Goal status: {GOALSTATUS:25110}  3.  *** Baseline:  Goal status: {GOALSTATUS:25110}  4.  *** Baseline:  Goal status: {GOALSTATUS:25110}  5.  *** Baseline:  Goal status: {GOALSTATUS:25110}  6.  *** Baseline:  Goal status: {GOALSTATUS:25110}  LONG TERM GOALS: Target date: ***  *** Baseline:  Goal status: {GOALSTATUS:25110}  2.  *** Baseline:  Goal status: {GOALSTATUS:25110}  3.  *** Baseline:  Goal status: {GOALSTATUS:25110}  4.  *** Baseline:  Goal status: {GOALSTATUS:25110}  5.  *** Baseline:  Goal status: {GOALSTATUS:25110}  6.  *** Baseline:  Goal status: {GOALSTATUS:25110}   PLAN:  PT FREQUENCY: {rehab frequency:25116}  PT DURATION: {rehab duration:25117}  PLANNED INTERVENTIONS: Therapeutic exercises, Therapeutic activity, Neuromuscular re-education, Balance training, Gait training, Patient/Family education, Self Care, Joint mobilization, Stair training, DME  instructions, Electrical stimulation, Cryotherapy, Moist heat, Manual therapy, and Re-evaluation  PLAN FOR NEXT SESSION: HEP review and update, manual techniques as appropriate, aerobic tasks, ROM and flexibility activities, strengthening and PREs, TPDN, gait and balance training as needed     Hildred Laser, PT 02/18/2023, 3:50 PM

## 2023-02-19 ENCOUNTER — Ambulatory Visit: Payer: Medicaid Other

## 2023-02-27 ENCOUNTER — Ambulatory Visit: Payer: Medicaid Other | Admitting: Sports Medicine

## 2023-03-03 ENCOUNTER — Encounter (HOSPITAL_BASED_OUTPATIENT_CLINIC_OR_DEPARTMENT_OTHER): Payer: Medicaid Other | Admitting: Internal Medicine

## 2023-03-03 ENCOUNTER — Ambulatory Visit: Payer: Medicaid Other | Admitting: Sports Medicine

## 2023-03-04 NOTE — Therapy (Unsigned)
OUTPATIENT PHYSICAL THERAPY LOWER EXTREMITY EVALUATION   Patient Name: Tonya Vaughn MRN: [id number] DOB:[birth year], 52 y.o., female Today's Date: 03/05/2023  END OF SESSION:  PT End of Session - 03/05/23 1444     Visit Number 1    Number of Visits 12    Date for PT Re-Evaluation 04/30/23    Authorization Type [place] MCD    PT Start Time 1400    PT Stop Time 1445    PT Time Calculation (min) 45 min    Activity Tolerance Patient tolerated treatment well    Behavior During Therapy [facility] for tasks assessed/performed             Past Medical History:  Diagnosis Date   Hypertension    Vitamin D deficiency    Past Surgical History:  Procedure Laterality Date   TUBAL LIGATION     TUBAL LIGATION  2009   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   Well woman exam 05/05/2011   CIGARETTE SMOKER 04/06/2010   KNEE PAIN, BILATERAL 04/06/2010   GASTROESOPHAGEAL REFLUX DISEASE, MILD 01/11/2010   OBESITY, UNSPECIFIED 12/15/2009   Sleep apnea 12/15/2009   DEPRESSION 12/14/2009    PCP: Harlen Labs, NP   REFERRING PROVIDER: Madelyn Brunner, DO   REFERRING DIAG: M25.561,M25.562,G89.29 (ICD-10-CM) - Chronic pain of both knees  THERAPY DIAG:  Chronic pain of both knees  Muscle weakness (generalized)  Other abnormalities of gait and mobility  Rationale for Evaluation and Treatment: Rehabilitation  ONSET DATE: chronic  SUBJECTIVE:   SUBJECTIVE STATEMENT: Tonya Vaughn is a pleasant 53 y.o. female who presents today for acute on chronic bilateral knee pain.   She has had bilateral knee pain for a few years now, recently has been worsening.  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  Will have intermittent swelling, although no significant swelling today.  Denies any inciting event or injury.  Has taken Aleve or over-the-counter pain  patches for only temporary relief.   She does note she has been bowlegged since childhood.   Pertinent ROS were reviewed with the patient and found to be negative unless otherwise specified above in HPI.   PERTINENT HISTORY: Plan: Discussed with Selena Batten today that she does have rather advanced medial joint tibiofemoral osteoarthritis with essentially bone-on-bone change of the medial compartment.  This is likely exacerbated by her bowlegged formation.  Her pain has been exacerbated given her osteoarthritic change.  She has failed the home conservative measures.  We discussed all treatment options such as oral medication therapy, physical therapy versus home exercises, injection therapy.  Will proceed with meloxicam 15 mg to be taken once daily.  Referral sent for formalized physical therapy to help strengthen and support the quadriceps and surrounding structures.  I would like to see her back in about 5 to 6 weeks to see what sort of relief she has gotten.  Additional treatment options may consider corticosteroid injection.  Do not think she would benefit from medial unloader bracing given her bowlegged.   Follow-up: Return in about 6 weeks (around 02/27/2023) for for b/l knee pain/OA.  PAIN:  Are you having pain? Yes: NPRS scale: 8/10 Pain location: B knees Pain description: ache Aggravating factors: weight bearing  Relieving factors: non weight bearing  PRECAUTIONS: None  WEIGHT BEARING RESTRICTIONS: No  FALLS:  Has patient fallen in last 6 months? No  OCCUPATION: Home care  PLOF: Independent  PATIENT GOALS: To reduce and manage my knee pain  NEXT MD VISIT: 04/10/23  OBJECTIVE:   DIAGNOSTIC FINDINGS: see imaging  PATIENT SURVEYS:  FOTO 59(64 predicted)  MUSCLE LENGTH: Hamstrings: Right 90 deg; Left 90 deg   POSTURE:  excess varus B knees , bayonet deformities   LOWER EXTREMITY ROM:  Active ROM Right eval Left eval  Hip flexion    Hip extension    Hip abduction     Hip adduction    Hip internal rotation    Hip external rotation    Knee flexion 135d 130d  Knee extension 0d 0d  Ankle dorsiflexion    Ankle plantarflexion    Ankle inversion    Ankle eversion     (Blank rows = not tested)  LOWER EXTREMITY MMT:  MMT Right eval Left eval  Hip flexion 4- 4-  Hip extension 4- 4-  Hip abduction 4- 4-  Hip adduction    Hip internal rotation    Hip external rotation    Knee flexion 4- 4-  Knee extension 4- 4-  Ankle dorsiflexion    Ankle plantarflexion 4- 4-  Ankle inversion    Ankle eversion     (Blank rows = not tested)  LOWER EXTREMITY SPECIAL TESTS:  Knee special tests: varus/valgus stress test  negative B  FUNCTIONAL TESTS:  5 times sit to stand: 19s arms crossed 30 seconds chair stand test N/T  GAIT: Distance walked: 58ftx2 Assistive device utilized: None Level of assistance: Complete Independence Comments: decreased trunk rotation and waddling gait pattern   TODAY'S TREATMENT:                                                                                                                              DATE: 03/05/23 Eval    PATIENT EDUCATION:  Education details: Discussed eval findings, rehab rationale and POC and patient is in agreement  Person educated: Patient Education method: Explanation Education comprehension: verbalized understanding and needs further education  HOME EXERCISE PROGRAM: Access Code: [email or web address] URL: [email or web address] Date: 03/05/2023 Prepared by: Gustavus Bryant  Exercises - Supine Quad Set  - 2 x daily - 5 x weekly - 2 sets - 10 reps - Small Range Straight Leg Raise  - 2 x daily - 5 x weekly - 2 sets - 10 reps - Supine Bridge  - 2 x daily - 5 x weekly - 2 sets - 10 reps - Standing Heel Raise with Support  - 2 x daily - 5 x weekly - 2 sets - 10 reps  ASSESSMENT:  CLINICAL IMPRESSION: Patient is a 52 y.o. female who was seen today for physical therapy evaluation and treatment for  chronic B knee pain. Patient presents with good AROM, stable to varus/valgus stress with decreased LE strength as evidenced by 5x STS time.  Some joint effusion noted by positive but mild bounce home test. Main concern is advanced OA and  resultant varus deformities.  She ha been offered cortisone injections for pain relief and is considering them.  Patient will benefit from OPPT to strengthen quads and LEs to unload knees and reduce pain.  OBJECTIVE IMPAIRMENTS: Abnormal gait, decreased activity tolerance, decreased endurance, decreased knowledge of condition, decreased mobility, difficulty walking, decreased ROM, decreased strength, improper body mechanics, postural dysfunction, and pain.   ACTIVITY LIMITATIONS: carrying, lifting, sitting, standing, squatting, stairs, and transfers  PERSONAL FACTORS: Fitness, Time since onset of injury/illness/exacerbation, and 1 comorbidity: OA and varus deformities  are also affecting patient's functional outcome.   REHAB POTENTIAL: Fair based on chronic degenerative and postural changes   CLINICAL DECISION MAKING: Stable/uncomplicated  EVALUATION COMPLEXITY: Low   GOALS: Goals reviewed with patient? No  SHORT TERM GOALS: Target date: 03/26/2023   Patient to demonstrate independence in HEP  Baseline: [email or web address] Goal status: INITIAL   LONG TERM GOALS: Target date: 04/16/2023  Increase FOTO score to 64 Baseline: 59 Goal status: INITIAL  2.  Increase B LE strength to 4/5 Baseline:  MMT Right eval Left eval  Hip flexion 4- 4-  Hip extension 4- 4-  Hip abduction 4- 4-  Hip adduction    Hip internal rotation    Hip external rotation    Knee flexion 4- 4-  Knee extension 4- 4-  Ankle dorsiflexion    Ankle plantarflexion 4- 4-   Goal status: INITIAL  3.  Decrease worst pain to 6/10 Baseline: 8/10 Goal status: INITIAL  4.  Decrease 5x STS time to 15s arms crossed Baseline: 19s arms crossed Goal status: INITIAL   PLAN:  PT FREQUENCY:  1-2x/week  PT DURATION: 6 weeks  PLANNED INTERVENTIONS: Therapeutic exercises, Therapeutic activity, Neuromuscular re-education, Balance training, Gait training, Patient/Family education, Self Care, Joint mobilization, Stair training, DME instructions, Aquatic Therapy, Dry Needling, Electrical stimulation, Cryotherapy, Moist heat, Taping, Manual therapy, and Re-evaluation  PLAN FOR NEXT SESSION: HEP review and update, manual techniques as appropriate, aerobic tasks, ROM and flexibility activities, strengthening and PREs, TPDN, gait and balance training as needed     Hildred Laser, PT 03/05/2023, 2:45 PM   Check all possible CPT codes: 54098 - PT Re-evaluation, 97110- Therapeutic Exercise, [phone]- Neuro Re-education, [phone] - Gait Training, [phone] - Manual Therapy, 97530 - Therapeutic Activities, and 97535 - Self Care    Check all conditions that are expected to impact treatment: {Conditions expected to impact treatment:Structural or anatomic abnormalities   If treatment provided at initial evaluation, no treatment charged due to lack of authorization.

## 2023-03-05 ENCOUNTER — Ambulatory Visit: Payer: Medicaid Other | Attending: Sports Medicine

## 2023-03-05 ENCOUNTER — Other Ambulatory Visit: Payer: Self-pay

## 2023-03-05 ENCOUNTER — Telehealth: Payer: Self-pay | Admitting: Sports Medicine

## 2023-03-05 ENCOUNTER — Other Ambulatory Visit: Payer: Self-pay | Admitting: Sports Medicine

## 2023-03-05 DIAGNOSIS — M6281 Muscle weakness (generalized): Secondary | ICD-10-CM | POA: Insufficient documentation

## 2023-03-05 DIAGNOSIS — R2689 Other abnormalities of gait and mobility: Secondary | ICD-10-CM | POA: Insufficient documentation

## 2023-03-05 DIAGNOSIS — G8929 Other chronic pain: Secondary | ICD-10-CM | POA: Insufficient documentation

## 2023-03-05 DIAGNOSIS — M25562 Pain in left knee: Secondary | ICD-10-CM | POA: Diagnosis present

## 2023-03-05 DIAGNOSIS — M25561 Pain in right knee: Secondary | ICD-10-CM | POA: Diagnosis present

## 2023-03-05 MED ORDER — MELOXICAM 15 MG PO TABS: 15.0000 mg | ORAL_TABLET | Freq: Every day | ORAL | 1 refills | Status: DC

## 2023-03-05 NOTE — Telephone Encounter (Signed)
Patient called needing Rx refilled. Patient said she threw the bottle away be accident. There were a couple of tabs left in the bottle. The number to contact patient is [phone]

## 2023-03-11 ENCOUNTER — Ambulatory Visit: Payer: Medicaid Other | Admitting: Sports Medicine

## 2023-03-17 ENCOUNTER — Ambulatory Visit: Payer: Medicaid Other

## 2023-03-18 NOTE — Therapy (Deleted)
OUTPATIENT PHYSICAL THERAPY LOWER EXTREMITY EVALUATION   Patient Name: Tonya Vaughn MRN: [id number] DOB:03/03/1971, 52 y.o., female Today's Date: 03/05/2023  END OF SESSION:  PT End of Session - 03/05/23 1444     Visit Number 1    Number of Visits 12    Date for PT Re-Evaluation 04/30/23    Authorization Type  MCD    PT Start Time 1400    PT Stop Time 1445    PT Time Calculation (min) 45 min    Activity Tolerance Patient tolerated treatment well    Behavior During Therapy WFL for tasks assessed/performed             Past Medical History:  Diagnosis Date   Hypertension    Vitamin D deficiency    Past Surgical History:  Procedure Laterality Date   TUBAL LIGATION     TUBAL LIGATION  2009   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   Well woman exam 05/05/2011   CIGARETTE SMOKER 04/06/2010   KNEE PAIN, BILATERAL 04/06/2010   GASTROESOPHAGEAL REFLUX DISEASE, MILD 01/11/2010   OBESITY, UNSPECIFIED 12/15/2009   Sleep apnea 12/15/2009   DEPRESSION 12/14/2009    PCP: [place], Jacqueline, NP   REFERRING PROVIDER: Brooks, Dana, DO   REFERRING DIAG: M25.561,M25.562,G89.29 (ICD-10-CM) - Chronic pain of both knees  THERAPY DIAG:  Chronic pain of both knees  Muscle weakness (generalized)  Other abnormalities of gait and mobility  Rationale for Evaluation and Treatment: Rehabilitation  ONSET DATE: chronic  SUBJECTIVE:   SUBJECTIVE STATEMENT: Tonya Vaughn is a pleasant 52 y.o. female who presents today for acute on chronic bilateral knee pain.   She has had bilateral knee pain for a few years now, recently has been worsening.  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  Will have intermittent swelling, although no significant swelling today.  Denies any inciting event or injury.  Has taken Aleve or over-the-counter pain  patches for only temporary relief.   She does note she has been bowlegged since childhood.   Pertinent ROS were reviewed with the patient and found to be negative unless otherwise specified above in HPI.   PERTINENT HISTORY: Plan: Discussed with Tonya Vaughn today that she does have rather advanced medial joint tibiofemoral osteoarthritis with essentially bone-on-bone change of the medial compartment.  This is likely exacerbated by her bowlegged formation.  Her pain has been exacerbated given her osteoarthritic change.  She has failed the home conservative measures.  We discussed all treatment options such as oral medication therapy, physical therapy versus home exercises, injection therapy.  Will proceed with meloxicam 15 mg to be taken once daily.  Referral sent for formalized physical therapy to help strengthen and support the quadriceps and surrounding structures.  I would like to see her back in about 5 to 6 weeks to see what sort of relief she has gotten.  Additional treatment options may consider corticosteroid injection.  Do not think she would benefit from medial unloader bracing given her bowlegged.   Follow-up: Return in about 6 weeks (around 02/27/2023) for for b/l knee pain/OA.  PAIN:  Are you having pain? Yes: NPRS scale: 8/10 Pain location: B knees Pain description: ache Aggravating factors: weight bearing  Relieving factors: non weight bearing  PRECAUTIONS: None  WEIGHT BEARING RESTRICTIONS: No  FALLS:  Has patient fallen in last 6 months? No     OCCUPATION: Home care  PLOF: Independent  PATIENT GOALS: To reduce and manage my knee pain  NEXT MD VISIT: 04/10/23  OBJECTIVE:   DIAGNOSTIC FINDINGS: see imaging  PATIENT SURVEYS:  FOTO 59(64 predicted)  MUSCLE LENGTH: Hamstrings: Right 90 deg; Left 90 deg   POSTURE:  excess varus B knees , bayonet deformities   LOWER EXTREMITY ROM:  Active ROM Right eval Left eval  Hip flexion    Hip extension    Hip abduction     Hip adduction    Hip internal rotation    Hip external rotation    Knee flexion 135d 130d  Knee extension 0d 0d  Ankle dorsiflexion    Ankle plantarflexion    Ankle inversion    Ankle eversion     (Blank rows = not tested)  LOWER EXTREMITY MMT:  MMT Right eval Left eval  Hip flexion 4- 4-  Hip extension 4- 4-  Hip abduction 4- 4-  Hip adduction    Hip internal rotation    Hip external rotation    Knee flexion 4- 4-  Knee extension 4- 4-  Ankle dorsiflexion    Ankle plantarflexion 4- 4-  Ankle inversion    Ankle eversion     (Blank rows = not tested)  LOWER EXTREMITY SPECIAL TESTS:  Knee special tests: varus/valgus stress test  negative B  FUNCTIONAL TESTS:  5 times sit to stand: 19s arms crossed 30 seconds chair stand test N/T  GAIT: Distance walked: 75ftx2 Assistive device utilized: None Level of assistance: Complete Independence Comments: decreased trunk rotation and waddling gait pattern   TODAY'S TREATMENT:                                                                                                                              DATE: 03/05/23 Eval    PATIENT EDUCATION:  Education details: Discussed eval findings, rehab rationale and POC and patient is in agreement  Person educated: Patient Education method: Explanation Education comprehension: verbalized understanding and needs further education  HOME EXERCISE PROGRAM: Access Code: [email or web address] URL: [email or web address] Date: 03/05/2023 Prepared by: Mieke Brinley  Exercises - Supine Quad Set  - 2 x daily - 5 x weekly - 2 sets - 10 reps - Small Range Straight Leg Raise  - 2 x daily - 5 x weekly - 2 sets - 10 reps - Supine Bridge  - 2 x daily - 5 x weekly - 2 sets - 10 reps - Standing Heel Raise with Support  - 2 x daily - 5 x weekly - 2 sets - 10 reps  ASSESSMENT:  CLINICAL IMPRESSION: Patient is a 52 y.o. female who was seen today for physical therapy evaluation and treatment for  chronic B knee pain. Patient presents with good AROM, stable to varus/valgus stress with decreased LE strength as evidenced by 5x STS time.  Some joint effusion noted by positive but mild bounce home test. Main concern is advanced OA and   resultant varus deformities.  She ha been offered cortisone injections for pain relief and is considering them.  Patient will benefit from OPPT to strengthen quads and LEs to unload knees and reduce pain.  OBJECTIVE IMPAIRMENTS: Abnormal gait, decreased activity tolerance, decreased endurance, decreased knowledge of condition, decreased mobility, difficulty walking, decreased ROM, decreased strength, improper body mechanics, postural dysfunction, and pain.   ACTIVITY LIMITATIONS: carrying, lifting, sitting, standing, squatting, stairs, and transfers  PERSONAL FACTORS: Fitness, Time since onset of injury/illness/exacerbation, and 1 comorbidity: OA and varus deformities  are also affecting patient's functional outcome.   REHAB POTENTIAL: Fair based on chronic degenerative and postural changes   CLINICAL DECISION MAKING: Stable/uncomplicated  EVALUATION COMPLEXITY: Low   GOALS: Goals reviewed with patient? No  SHORT TERM GOALS: Target date: 03/26/2023   Patient to demonstrate independence in HEP  Baseline: [email or web address] Goal status: INITIAL   LONG TERM GOALS: Target date: 04/16/2023  Increase FOTO score to 64 Baseline: 59 Goal status: INITIAL  2.  Increase B LE strength to 4/5 Baseline:  MMT Right eval Left eval  Hip flexion 4- 4-  Hip extension 4- 4-  Hip abduction 4- 4-  Hip adduction    Hip internal rotation    Hip external rotation    Knee flexion 4- 4-  Knee extension 4- 4-  Ankle dorsiflexion    Ankle plantarflexion 4- 4-   Goal status: INITIAL  3.  Decrease worst pain to 6/10 Baseline: 8/10 Goal status: INITIAL  4.  Decrease 5x STS time to 15s arms crossed Baseline: 19s arms crossed Goal status: INITIAL   PLAN:  PT FREQUENCY:  1-2x/week  PT DURATION: 6 weeks  PLANNED INTERVENTIONS: Therapeutic exercises, Therapeutic activity, Neuromuscular re-education, Balance training, Gait training, Patient/Family education, Self Care, Joint mobilization, Stair training, DME instructions, Aquatic Therapy, Dry Needling, Electrical stimulation, Cryotherapy, Moist heat, Taping, Manual therapy, and Re-evaluation  PLAN FOR NEXT SESSION: HEP review and update, manual techniques as appropriate, aerobic tasks, ROM and flexibility activities, strengthening and PREs, TPDN, gait and balance training as needed     Dalesha Stanback M Margarita Bobrowski, PT 03/05/2023, 2:45 PM   Check all possible CPT codes: 97164 - PT Re-evaluation, 97110- Therapeutic Exercise, 97112- Neuro Re-education, 97116 - Gait Training, 97140 - Manual Therapy, 97530 - Therapeutic Activities, and 97535 - Self Care    Check all conditions that are expected to impact treatment: {Conditions expected to impact treatment:Structural or anatomic abnormalities   If treatment provided at initial evaluation, no treatment charged due to lack of authorization.      

## 2023-03-19 ENCOUNTER — Ambulatory Visit: Payer: Medicaid Other

## 2023-03-24 ENCOUNTER — Ambulatory Visit: Payer: Medicaid Other

## 2023-03-24 ENCOUNTER — Telehealth: Payer: Self-pay

## 2023-03-25 NOTE — Therapy (Deleted)
OUTPATIENT PHYSICAL THERAPY LOWER EXTREMITY EVALUATION   Patient Name: Tonya Vaughn MRN: [id number] DOB:08/02/1971, 52 y.o., female Today's Date: 03/05/2023  END OF SESSION:  PT End of Session - 03/05/23 1444     Visit Number 1    Number of Visits 12    Date for PT Re-Evaluation 04/30/23    Authorization Type [place] [employer]    PT Start Time 1400    PT Stop Time 1445    PT Time Calculation (min) 45 min    Activity Tolerance Patient tolerated treatment well    Behavior During Therapy WFL for tasks assessed/performed             Past Medical History:  Diagnosis Date   Hypertension    Vitamin D deficiency    Past Surgical History:  Procedure Laterality Date   TUBAL LIGATION     TUBAL LIGATION  2009   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   Well woman exam 05/05/2011   CIGARETTE SMOKER 04/06/2010   KNEE PAIN, BILATERAL 04/06/2010   GASTROESOPHAGEAL REFLUX DISEASE, MILD 01/11/2010   OBESITY, UNSPECIFIED 12/15/2009   Sleep apnea 12/15/2009   DEPRESSION 12/14/2009    PCP: Mawoneke, Jacqueline, NP   REFERRING PROVIDER: Brooks, Dana, DO   REFERRING DIAG: M25.561,M25.562,G89.29 (ICD-10-CM) - Chronic pain of both knees  THERAPY DIAG:  Chronic pain of both knees  Muscle weakness (generalized)  Other abnormalities of gait and mobility  Rationale for Evaluation and Treatment: Rehabilitation  ONSET DATE: chronic  SUBJECTIVE:   SUBJECTIVE STATEMENT: Tonya Vaughn is a pleasant 52 y.o. female who presents today for acute on chronic bilateral knee pain.   She has had bilateral knee pain for a few years now, recently has been worsening.  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  Will have intermittent swelling, although no significant swelling today.  Denies any inciting event or injury.  Has taken Aleve or over-the-counter pain  patches for only temporary relief.   She does note she has been bowlegged since childhood.   Pertinent ROS were reviewed with the patient and found to be negative unless otherwise specified above in HPI.   PERTINENT HISTORY: Plan: Discussed with Kim today that she does have rather advanced medial joint tibiofemoral osteoarthritis with essentially bone-on-bone change of the medial compartment.  This is likely exacerbated by her bowlegged formation.  Her pain has been exacerbated given her osteoarthritic change.  She has failed the home conservative measures.  We discussed all treatment options such as oral medication therapy, physical therapy versus home exercises, injection therapy.  Will proceed with meloxicam 15 mg to be taken once daily.  Referral sent for formalized physical therapy to help strengthen and support the quadriceps and surrounding structures.  I would like to see her back in about 5 to 6 weeks to see what sort of relief she has gotten.  Additional treatment options may consider corticosteroid injection.  Do not think she would benefit from medial unloader bracing given her bowlegged.   Follow-up: Return in about 6 weeks (around 02/27/2023) for for b/l knee pain/OA.  PAIN:  Are you having pain? Yes: NPRS scale: 8/10 Pain location: B knees Pain description: ache Aggravating factors: weight bearing  Relieving factors: non weight bearing  PRECAUTIONS: None  WEIGHT BEARING RESTRICTIONS: No  FALLS:  Has patient fallen in last 6 months? No     OCCUPATION: Home care  PLOF: Independent  PATIENT GOALS: To reduce and manage my knee pain  NEXT MD VISIT: 04/10/23  OBJECTIVE:   DIAGNOSTIC FINDINGS: see imaging  PATIENT SURVEYS:  FOTO 59(64 predicted)  MUSCLE LENGTH: Hamstrings: Right 90 deg; Left 90 deg   POSTURE:  excess varus B knees , bayonet deformities   LOWER EXTREMITY ROM:  Active ROM Right eval Left eval  Hip flexion    Hip extension    Hip abduction     Hip adduction    Hip internal rotation    Hip external rotation    Knee flexion 135d 130d  Knee extension 0d 0d  Ankle dorsiflexion    Ankle plantarflexion    Ankle inversion    Ankle eversion     (Blank rows = not tested)  LOWER EXTREMITY MMT:  MMT Right eval Left eval  Hip flexion 4- 4-  Hip extension 4- 4-  Hip abduction 4- 4-  Hip adduction    Hip internal rotation    Hip external rotation    Knee flexion 4- 4-  Knee extension 4- 4-  Ankle dorsiflexion    Ankle plantarflexion 4- 4-  Ankle inversion    Ankle eversion     (Blank rows = not tested)  LOWER EXTREMITY SPECIAL TESTS:  Knee special tests: varus/valgus stress test  negative B  FUNCTIONAL TESTS:  5 times sit to stand: 19s arms crossed 30 seconds chair stand test N/T  GAIT: Distance walked: 75ftx2 Assistive device utilized: None Level of assistance: Complete Independence Comments: decreased trunk rotation and waddling gait pattern   TODAY'S TREATMENT:                                                                                                                              DATE: 03/05/23 Eval    PATIENT EDUCATION:  Education details: Discussed eval findings, rehab rationale and POC and patient is in agreement  Person educated: Patient Education method: Explanation Education comprehension: verbalized understanding and needs further education  HOME EXERCISE PROGRAM: Access Code: [email or web address] URL: [email or web address] Date: 03/05/2023 Prepared by: Sterlin Knightly  Exercises - Supine Quad Set  - 2 x daily - 5 x weekly - 2 sets - 10 reps - Small Range Straight Leg Raise  - 2 x daily - 5 x weekly - 2 sets - 10 reps - Supine Bridge  - 2 x daily - 5 x weekly - 2 sets - 10 reps - Standing Heel Raise with Support  - 2 x daily - 5 x weekly - 2 sets - 10 reps  ASSESSMENT:  CLINICAL IMPRESSION: Patient is a 52 y.o. female who was seen today for physical therapy evaluation and treatment for  chronic B knee pain. Patient presents with good AROM, stable to varus/valgus stress with decreased LE strength as evidenced by 5x STS time.  Some joint effusion noted by positive but mild bounce home test. Main concern is advanced OA and   resultant varus deformities.  She ha been offered cortisone injections for pain relief and is considering them.  Patient will benefit from OPPT to strengthen quads and LEs to unload knees and reduce pain.  OBJECTIVE IMPAIRMENTS: Abnormal gait, decreased activity tolerance, decreased endurance, decreased knowledge of condition, decreased mobility, difficulty walking, decreased ROM, decreased strength, improper body mechanics, postural dysfunction, and pain.   ACTIVITY LIMITATIONS: carrying, lifting, sitting, standing, squatting, stairs, and transfers  PERSONAL FACTORS: Fitness, Time since onset of injury/illness/exacerbation, and 1 comorbidity: OA and varus deformities  are also affecting patient's functional outcome.   REHAB POTENTIAL: Fair based on chronic degenerative and postural changes   CLINICAL DECISION MAKING: Stable/uncomplicated  EVALUATION COMPLEXITY: Low   GOALS: Goals reviewed with patient? No  SHORT TERM GOALS: Target date: 03/26/2023   Patient to demonstrate independence in HEP  Baseline: [email or web address] Goal status: INITIAL   LONG TERM GOALS: Target date: 04/16/2023  Increase FOTO score to 64 Baseline: 59 Goal status: INITIAL  2.  Increase B LE strength to 4/5 Baseline:  MMT Right eval Left eval  Hip flexion 4- 4-  Hip extension 4- 4-  Hip abduction 4- 4-  Hip adduction    Hip internal rotation    Hip external rotation    Knee flexion 4- 4-  Knee extension 4- 4-  Ankle dorsiflexion    Ankle plantarflexion 4- 4-   Goal status: INITIAL  3.  Decrease worst pain to 6/10 Baseline: 8/10 Goal status: INITIAL  4.  Decrease 5x STS time to 15s arms crossed Baseline: 19s arms crossed Goal status: INITIAL   PLAN:  PT FREQUENCY:  1-2x/week  PT DURATION: 6 weeks  PLANNED INTERVENTIONS: Therapeutic exercises, Therapeutic activity, Neuromuscular re-education, Balance training, Gait training, Patient/Family education, Self Care, Joint mobilization, Stair training, DME instructions, Aquatic Therapy, Dry Needling, Electrical stimulation, Cryotherapy, Moist heat, Taping, Manual therapy, and Re-evaluation  PLAN FOR NEXT SESSION: HEP review and update, manual techniques as appropriate, aerobic tasks, ROM and flexibility activities, strengthening and PREs, TPDN, gait and balance training as needed     Maize Brittingham M Keisy Strickler, PT 03/05/2023, 2:45 PM   Check all possible CPT codes: 97164 - PT Re-evaluation, 97110- Therapeutic Exercise, 97112- Neuro Re-education, 97116 - Gait Training, 97140 - Manual Therapy, 97530 - Therapeutic Activities, and 97535 - Self Care    Check all conditions that are expected to impact treatment: {Conditions expected to impact treatment:Structural or anatomic abnormalities   If treatment provided at initial evaluation, no treatment charged due to lack of authorization.      

## 2023-03-26 ENCOUNTER — Ambulatory Visit: Payer: Medicaid Other

## 2023-03-27 ENCOUNTER — Ambulatory Visit: Payer: Medicaid Other | Admitting: Sports Medicine

## 2023-04-02 ENCOUNTER — Ambulatory Visit: Payer: Medicaid Other

## 2023-04-06 ENCOUNTER — Encounter (HOSPITAL_BASED_OUTPATIENT_CLINIC_OR_DEPARTMENT_OTHER): Payer: Medicaid Other | Admitting: Internal Medicine

## 2023-04-18 ENCOUNTER — Ambulatory Visit: Payer: Medicaid Other | Admitting: Sports Medicine

## 2023-04-28 ENCOUNTER — Ambulatory Visit: Payer: Medicaid Other | Admitting: Sports Medicine

## 2023-05-15 ENCOUNTER — Ambulatory Visit: Payer: Medicaid Other | Admitting: Podiatry

## 2023-05-26 ENCOUNTER — Ambulatory Visit: Payer: Medicaid Other | Admitting: Podiatry

## 2023-06-05 ENCOUNTER — Ambulatory Visit: Payer: Medicaid Other | Admitting: Podiatry

## 2023-06-12 ENCOUNTER — Ambulatory Visit: Payer: Medicaid Other | Admitting: Sports Medicine

## 2023-06-13 ENCOUNTER — Ambulatory Visit: Payer: Medicaid Other | Admitting: Sports Medicine

## 2023-06-17 ENCOUNTER — Ambulatory Visit: Payer: Medicaid Other | Admitting: Podiatry

## 2023-06-24 ENCOUNTER — Encounter: Payer: Self-pay | Admitting: Sports Medicine

## 2023-06-24 ENCOUNTER — Ambulatory Visit: Payer: Medicaid Other | Admitting: Sports Medicine

## 2023-06-24 ENCOUNTER — Other Ambulatory Visit (INDEPENDENT_AMBULATORY_CARE_PROVIDER_SITE_OTHER): Payer: Medicaid Other

## 2023-06-24 DIAGNOSIS — M7712 Lateral epicondylitis, left elbow: Secondary | ICD-10-CM | POA: Diagnosis not present

## 2023-06-24 DIAGNOSIS — M25522 Pain in left elbow: Secondary | ICD-10-CM

## 2023-06-24 DIAGNOSIS — M170 Bilateral primary osteoarthritis of knee: Secondary | ICD-10-CM

## 2023-06-24 DIAGNOSIS — Q685 Congenital bowing of long bones of leg, unspecified: Secondary | ICD-10-CM | POA: Diagnosis not present

## 2023-06-24 MED ORDER — METHYLPREDNISOLONE 4 MG PO TBPK: ORAL_TABLET | ORAL | 0 refills | Status: DC

## 2023-06-24 NOTE — Progress Notes (Signed)
Patient says that her knees are giving her the same pain as previously, but this time more painful and intense. She says that the pain is making it difficult to sleep. She says that she has been taking Meloxicam but it does not help; patient uses heat at night.  Patient says that her left elbow has been bothering her for about a month. She describes her pain as similar to the knee pain but in the elbow. She denies any injury to the elbow. Patient points to lateral aspect of the elbow when describing her pain. Patient denies any numbness, tingling, popping at the elbow or arm.  Patient was instructed in 10 minutes of therapeutic exercises for left elbow to improve strength, ROM and function according to my instructions and plan of care by a Certified Athletic Trainer during the office visit. A customized handout was provided and demonstration of proper technique shown and discussed. Patient did perform exercises and demonstrate understanding through teachback.  All questions discussed and answered.

## 2023-06-24 NOTE — Progress Notes (Signed)
Tonya Vaughn - 52 y.o. female MRN [id number]  Date of birth: [birth year]  Office Visit Note: Visit Date: 06/24/2023 PCP: Harlen Labs, NP Referred by: Harlen Labs, NP  Subjective: Chief Complaint  Patient presents with   Left Elbow - Pain   Right Knee - Pain   Left Knee - Pain   HPI: Tonya Vaughn is a pleasant 52 y.o. female who presents today for acute on chronic bilateral knee pain, left lateral elbow pain.  Bilateral knee pain - she has known severe osteoarthritic change of the medial tibiofemoral compartment with obvious varus deformity of bilateral knees.  She only receives mild relief from over-the-counter Aleve, Tylenol, trial of meloxicam.  Does get some intermittent swelling in the knees.  Left lateral elbow pain -this has been present for the last month or more.  She has pain over the lateral elbow which will extend proximally and slightly distally.  She denies any locking, instability or numbness tingling about the elbow.  No specific inciting event or injury.  Pertinent ROS were reviewed with the patient and found to be negative unless otherwise specified above in HPI.   Assessment & Plan: Visit Diagnoses:  1. Bilateral primary osteoarthritis of knee   2. Pain in left elbow   3. Congenital bowed legs   4. Lateral epicondylitis, left elbow    Plan: Discussed with Tonya Vaughn the nature of her advanced medial joint tibiofemoral osteoarthritis, which is essentially bone-on-bone of the medial compartment.  She does have chronic varus bowlegged formation which likely contributed to her medial OA.  She is not getting much relief with HEP, over-the-counter anti-inflammatories, activity modification and meloxicam.  We did discuss the role for corticosteroid injection, she will think about this.  I do not think she would get much benefit from a medial unloader brace given her chronic congenital bowlegged nature.  Did discuss that ultimately she may be a candidate for  total knee arthroplasty, but she is not ready to proceed with this.  In terms of her left elbow pain, this does seem to be indicative of lateral epicondylitis, although she does have some spurring about the radiocapitellar joint on x-ray.  We will treat this with a 6-day course of Medrol Dosepak to help with inflammation and pain.  She does not have time for formalized physical therapy, given this we did print out a customized handout for lateral epicondyle and elbow rehab exercises.  Tonya Vaughn did review these in the room with her, she will perform these once daily.  She will think about treatment options for the knee and let me know and she may follow-up at her leisure.  I did fill out a handicap placard given her severe right knee osteoarthritis today.  Follow-up: Return as needed for bilateral knees (consider injections)   Meds & Orders:  Meds ordered this encounter  Medications   methylPREDNISolone (MEDROL DOSEPAK) 4 MG TBPK tablet    Sig: Take per packet instructions. Taper dosing.    Dispense:  1 each    Refill:  0    Orders Placed This Encounter  Procedures   XR Elbow 2 Views Left     Procedures: No procedures performed      Clinical History: No specialty comments available.  She reports that she has been smoking cigarettes. She has never used smokeless tobacco. No results for input(s): "HGBA1C", "LABURIC" in the last 8760 hours.  Objective:    Physical Exam  Gen: Well-appearing, in no acute distress; non-toxic  CV: Well-perfused. Warm.  Resp: Breathing unlabored on room air; no wheezing. Psych: Fluid speech in conversation; appropriate affect; normal thought process Neuro: Sensation intact throughout. No gross coordination deficits.   Ortho Exam - Bilateral knees: + TTP over bilateral medial joint line.  There is obvious varus bowlegged formation of both legs, right greater than left.  Range of motion 0-125, 0-130 degrees of the right and left knee.  There is  some pain and pseudo instability with varus testing without significant giveway.  Patellar crepitus noted.  He walks with mildly antalgic gait.  - Left elbow: Positive TTP over the lateral epicondyle.  No redness swelling or effusion of the elbow.  Full range of motion.  Positive Kozan's test, positive pain with resisted pronation/supination.  No gross weakness noted.  Imaging: XR Elbow 2 Views Left  Result Date: 06/24/2023 3 views of the left elbow including AP and lateral femoral ordered and reviewed by myself.  X-rays demonstrate some spurring over the superior aspect of the radial head near the radiocapitellar joint.  There is no acute fracture or otherwise bony abnormality noted.  No fat pad sign.   Past Medical/Family/Surgical/Social History: Medications & Allergies reviewed per EMR, new medications updated. 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   Well woman exam 05/05/2011   CIGARETTE SMOKER 04/06/2010   KNEE PAIN, BILATERAL 04/06/2010   GASTROESOPHAGEAL REFLUX DISEASE, MILD 01/11/2010   OBESITY, UNSPECIFIED 12/15/2009   Sleep apnea 12/15/2009   DEPRESSION 12/14/2009   Past Medical History:  Diagnosis Date   Hypertension    Vitamin D deficiency    Family History  Problem Relation Age of Onset   Breast cancer Maternal Grandmother    Past Surgical History:  Procedure Laterality Date   TUBAL LIGATION     TUBAL LIGATION  2009   Social History   Occupational History   Not on file  Tobacco Use   Smoking status: Every Day    Current packs/day: 0.50    Types: Cigarettes   Smokeless tobacco: Never   Tobacco comments:    pt states 'i don't smoke that much'  Substance and Sexual Activity   Alcohol use: Yes    Comment: occ   Drug use: No   Sexual activity: Not on file

## 2023-08-04 ENCOUNTER — Encounter: Payer: Self-pay | Admitting: Sports Medicine

## 2023-08-04 ENCOUNTER — Other Ambulatory Visit: Payer: Self-pay

## 2023-08-04 ENCOUNTER — Ambulatory Visit (INDEPENDENT_AMBULATORY_CARE_PROVIDER_SITE_OTHER): Payer: 59 | Admitting: Sports Medicine

## 2023-08-04 DIAGNOSIS — M170 Bilateral primary osteoarthritis of knee: Secondary | ICD-10-CM | POA: Diagnosis not present

## 2023-08-04 DIAGNOSIS — M25561 Pain in right knee: Secondary | ICD-10-CM | POA: Diagnosis not present

## 2023-08-04 DIAGNOSIS — Q685 Congenital bowing of long bones of leg, unspecified: Secondary | ICD-10-CM | POA: Diagnosis not present

## 2023-08-04 DIAGNOSIS — G8929 Other chronic pain: Secondary | ICD-10-CM

## 2023-08-04 DIAGNOSIS — M25461 Effusion, right knee: Secondary | ICD-10-CM

## 2023-08-04 MED ORDER — METHYLPREDNISOLONE ACETATE 40 MG/ML IJ SUSP
80.0000 mg | INTRAMUSCULAR | Status: AC | PRN
  Administered 2023-08-04: 80 mg via INTRA_ARTICULAR

## 2023-08-04 MED ORDER — LIDOCAINE HCL 1 % IJ SOLN
1.0000 mL | INTRAMUSCULAR | Status: AC | PRN
  Administered 2023-08-04: 1 mL

## 2023-08-04 MED ORDER — MELOXICAM 15 MG PO TABS: 15.0000 mg | ORAL_TABLET | Freq: Every day | ORAL | 0 refills | Status: DC

## 2023-08-04 MED ORDER — BUPIVACAINE HCL 0.25 % IJ SOLN
2.0000 mL | INTRAMUSCULAR | Status: AC | PRN
  Administered 2023-08-04: 2 mL via INTRA_ARTICULAR

## 2023-08-04 NOTE — Progress Notes (Signed)
Tonya Vaughn - 52 y.o. female MRN [id number]  Date of birth: 1971/04/26  Office Visit Note: Visit Date: 08/04/2023 PCP: Harlen Labs, NP Referred by: Harlen Labs, NP  Subjective: Chief Complaint  Patient presents with   Right Knee - Pain   HPI: Tonya Vaughn is a pleasant 52 y.o. female who presents today for chronic bilateral knee pain with right knee exacerbation.  She has had issues with both of her knees chronically, here over the last week her right knee pain has significantly increased, pain on the inner aspect of the knee joint.  She does feel like it is swollen as well. She is having difficulty walking because of this.  In terms of medication, she had taken meloxicam in the past which helped some. The left knee gives her some issues, but is definitely not as bothersome as the right knee.   She is not diabetic. Lab Results  Component Value Date   HGBA1C 5.6 10/08/2016   Pertinent ROS were reviewed with the patient and found to be negative unless otherwise specified above in HPI.   Assessment & Plan: Visit Diagnoses:  1. Chronic pain of right knee   2. Effusion, right knee   3. Bilateral primary osteoarthritis of knee   4. Congenital bowed legs    Plan: Impression is chronic bilateral knee osteoarthritis that is severe in nature with congenital bowleggedness.  The main driver of her pain is a right knee effusion secondary to osteoarthritis flare.  Through shared decision making we did proceed with ultrasound-guided knee aspiration and subsequent injection.  Kim felt much better after pulling off a large knee effusion.  She will use ice and compression for the next 48 to 72 hours.  We will start her back on a short course of meloxicam 15 mg daily for both of her knees. Discussed obtaining neoprene compression knee sleeve. We will see her back in about 1 month to reevaluate.  Did discuss if she has recurrent effusion, permanent solution would be discussion  on possible knee replacement.  Follow-up: Return in about 1 month (around 09/03/2023) for for Right knee (R > L).   Meds & Orders:  Meds ordered this encounter  Medications   meloxicam (MOBIC) 15 MG tablet    Sig: Take 1 tablet (15 mg total) by mouth daily.    Dispense:  30 tablet    Refill:  0    Orders Placed This Encounter  Procedures   Large Joint Inj: R knee   [place] Extrem Low Right Ltd     Procedures: Large Joint Inj: R knee on 08/04/2023 4:05 PM Indications: joint swelling and pain Details: 18 G 1.5 in needle, superolateral approach Medications: 2 mL bupivacaine 0.25 %; 80 mg methylPREDNISolone acetate 40 MG/ML; 1 mL lidocaine 1 % Aspirate: 52 mL clear and yellow Outcome: tolerated well, no immediate complications  US-guided Knee Aspiration, Right: After discussion on risks/benefits/indications was provided, informed verbal consent was obtained and a timeout was performed, patient was lying supine on exam table. The knee was prepped with chloraprep and alcohol swabs. Utilizing superolateral approach, approximately 5 mL of lidocaine 1% was used for local anesthesia. Then using ultrasound guidance using an in-plane approach, an 18g needle on 60cc syringe, 52 mL of clear straw-colored fluid was aspirated from the knee. Utilizing the same portal and under US-guidance, the knee joint was then injected with 1:2:2 lidocaine:bupivicaine:depomedrol.  Patient tolerated procedure well without immediate complications.  Procedure, treatment alternatives, risks and benefits explained,  specific risks discussed. Consent was given by the patient. Immediately prior to procedure a time out was called to verify the correct patient, procedure, equipment, support staff and site/side marked as required. Patient was prepped and draped in the usual sterile fashion.          Clinical History: No specialty comments available.  She reports that she has been smoking cigarettes. She has never used  smokeless tobacco. No results for input(s): "HGBA1C", "LABURIC" in the last 8760 hours.  Objective:    Physical Exam  Gen: Well-appearing, in no acute distress; non-toxic CV: Well-perfused. Warm.  Resp: Breathing unlabored on room air; no wheezing. Psych: Fluid speech in conversation; appropriate affect; normal thought process Neuro: Sensation intact throughout. No gross coordination deficits.   Ortho Exam - Right knee: There is a moderate effusion about the knee with some warmth.  No redness or signs of cellulitic change.  Limited range of motion from 0-115 degrees.  No varus or valgus instability although still instability with varus/valgus testing.  - Bilateral knees/Gait: There is quite significant varus bowlegged formation of both legs, right greater than left.  Range of motion of the left knee is 0-130 degrees.  No significant effusion of the left knee.  Imaging:  [place] Extrem Low Right Ltd Limited musculoskeletal ultrasound of the right knee was performed today.   Evaluation of the patella shows patellar spurring.  No cortical regularity  or fracture.  There is a moderate to large knee effusion in the  suprapatellar bursa. Overlying quadricep tendon seen without evidence of  tearing or tendinitis.  *Technically successful ultrasound-guided right knee aspiration and  injection.   Knee XR 01/16/23: 4 views of bilateral knee including AP standing, Rosenberg, lateral and  sunrise views were ordered and reviewed by myself today.  X-rays  demonstrate complete collapse of the medial joint space bilaterally with  severe osteoarthritic change in the medial tibiofemoral compartment as  well as moderate patellofemoral arthralgia with bony spurring.  There is  some sclerosis of the medial femoral condyle bilaterally.  Lateral joint  line relatively well-preserved.  No acute fracture noted.  There is  obvious varus formation of bilateral knees.   Past Medical/Family/Surgical/Social  History: Medications & Allergies reviewed per EMR, new medications updated. 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   Well woman exam 05/05/2011   CIGARETTE SMOKER 04/06/2010   KNEE PAIN, BILATERAL 04/06/2010   GASTROESOPHAGEAL REFLUX DISEASE, MILD 01/11/2010   OBESITY, UNSPECIFIED 12/15/2009   Sleep apnea 12/15/2009   DEPRESSION 12/14/2009   Past Medical History:  Diagnosis Date   Hypertension    Vitamin D deficiency    Family History  Problem Relation Age of Onset   Breast cancer Maternal Grandmother    Past Surgical History:  Procedure Laterality Date   TUBAL LIGATION     TUBAL LIGATION  2009   Social History   Occupational History   Not on file  Tobacco Use   Smoking status: Every Day    Current packs/day: 0.50    Types: Cigarettes   Smokeless tobacco: Never   Tobacco comments:    pt states 'i don't smoke that much'  Substance and Sexual Activity   Alcohol use: Yes    Comment: occ   Drug use: No   Sexual activity: Not on file

## 2023-08-04 NOTE — Progress Notes (Signed)
Patient says that last weekend and the last week she has had increased pain in the right knee. She says that the left knee is painful but not anything new compared to what she is used to. She says that she has noticed some swelling in the right knee as well. She denies any new injury. She says that she is having difficulty walking.

## 2023-08-07 DIAGNOSIS — Z23 Encounter for immunization: Secondary | ICD-10-CM | POA: Diagnosis not present

## 2023-08-07 DIAGNOSIS — N393 Stress incontinence (female) (male): Secondary | ICD-10-CM | POA: Diagnosis not present

## 2023-08-07 DIAGNOSIS — N1831 Chronic kidney disease, stage 3a: Secondary | ICD-10-CM | POA: Diagnosis not present

## 2023-08-07 DIAGNOSIS — I10 Essential (primary) hypertension: Secondary | ICD-10-CM | POA: Diagnosis not present

## 2023-08-07 DIAGNOSIS — M170 Bilateral primary osteoarthritis of knee: Secondary | ICD-10-CM | POA: Diagnosis not present

## 2023-09-02 ENCOUNTER — Ambulatory Visit: Payer: 59 | Admitting: Sports Medicine

## 2023-09-16 ENCOUNTER — Ambulatory Visit: Payer: 59 | Admitting: Sports Medicine

## 2023-09-25 ENCOUNTER — Ambulatory Visit: Payer: 59 | Admitting: Sports Medicine

## 2023-10-06 ENCOUNTER — Ambulatory Visit (INDEPENDENT_AMBULATORY_CARE_PROVIDER_SITE_OTHER): Payer: 59 | Admitting: Sports Medicine

## 2023-10-06 ENCOUNTER — Encounter: Payer: Self-pay | Admitting: Sports Medicine

## 2023-10-06 DIAGNOSIS — G8929 Other chronic pain: Secondary | ICD-10-CM | POA: Diagnosis not present

## 2023-10-06 DIAGNOSIS — M170 Bilateral primary osteoarthritis of knee: Secondary | ICD-10-CM

## 2023-10-06 DIAGNOSIS — Q685 Congenital bowing of long bones of leg, unspecified: Secondary | ICD-10-CM

## 2023-10-06 DIAGNOSIS — M25561 Pain in right knee: Secondary | ICD-10-CM

## 2023-10-06 DIAGNOSIS — M25562 Pain in left knee: Secondary | ICD-10-CM

## 2023-10-06 MED ORDER — MELOXICAM 15 MG PO TABS: 15.0000 mg | ORAL_TABLET | Freq: Every day | ORAL | 0 refills | Status: DC

## 2023-10-06 MED ORDER — METHYLPREDNISOLONE ACETATE 40 MG/ML IJ SUSP
40.0000 mg | INTRAMUSCULAR | Status: AC | PRN
  Administered 2023-10-06: 40 mg via INTRA_ARTICULAR

## 2023-10-06 MED ORDER — BUPIVACAINE HCL 0.25 % IJ SOLN
2.0000 mL | INTRAMUSCULAR | Status: AC | PRN
  Administered 2023-10-06: 2 mL via INTRA_ARTICULAR

## 2023-10-06 MED ORDER — LIDOCAINE HCL 1 % IJ SOLN
2.0000 mL | INTRAMUSCULAR | Status: AC | PRN
  Administered 2023-10-06: 2 mL

## 2023-10-06 NOTE — Progress Notes (Signed)
Tonya Vaughn - 53 y.o. female MRN [id number]  Date of birth: Dec 17, 1970  Office Visit Note: Visit Date: 10/06/2023 PCP: Harlen Labs, NP Referred by: Harlen Labs, NP  Subjective: Chief Complaint  Patient presents with   Right Knee - Pain   Left Knee - Pain   HPI: Tonya Vaughn is a pleasant 53 y.o. female who presents today for chronic bilateral knee pain with advanced OA.  Tonya Vaughn has had an exacerbation of her right and left knee pain which she has x-ray confirmed advanced arthritis.  Her right knee is worse than her left.  She did have an injection back in early November which gave her good relief but this only lasted a few weeks.  She does have a lot of crepitus and grinding of the right.  She is not at a point where she could consider knee replacement given her work schedule.  She is using meloxicam 15 mg daily as well as Tylenol as needed which helps take the ease off.  Pertinent ROS were reviewed with the patient and found to be negative unless otherwise specified above in HPI.   Assessment & Plan: Visit Diagnoses:  1. Bilateral primary osteoarthritis of knee   2. Chronic pain of both knees   3. Congenital bowed legs    Plan: Impression is acute on chronic bilateral knee pain with advanced osteoarthritis in the setting of varus/bowlegged deformity of each leg.  She does have right knee bone-on-bone medial joint space collapse and left knee moderate to severe medial joint space collapse.  She is not at a point where she is considering knee replacement.  Through shared decision-making, we proceeded with bilateral knee corticosteroid injection.  She may use ice/heat, Tylenol for any postinjection pain.  She will continue her meloxicam 15 mg once daily, I did refill this for her.  She has done therapy in the past, she will continue her home exercises had a 2 to 3x/weekly interval.  We will follow-up in 3 months for reevaluation.  I did discuss with her if she does not  receive significant relief from these injections, she needs to strongly consider knee replacement, she is understanding.    Note provided for work to take seated breaks for the next 2 days only as needed  Follow-up: Return in about 3 months (around 01/04/2024) for b/l knees .   Meds & Orders:  Meds ordered this encounter  Medications   meloxicam (MOBIC) 15 MG tablet    Sig: Take 1 tablet (15 mg total) by mouth daily.    Dispense:  30 tablet    Refill:  0    Orders Placed This Encounter  Procedures   Large Joint Inj   Large Joint Inj     Procedures: Large Joint Inj: R knee on 10/06/2023 3:49 PM Indications: pain Details: 22 G 1.5 in needle, anterolateral approach Medications: 2 mL lidocaine 1 %; 2 mL bupivacaine 0.25 %; 40 mg methylPREDNISolone acetate 40 MG/ML Outcome: tolerated well, no immediate complications  Knee Injection, Right: After discussion on risks/benefits/indications, informed verbal consent was obtained and a timeout was performed, patient was seated on exam table. The patient's knee was prepped with Betadine and alcohol swab and utilizing anterolateral approach, the patient's knee was injected intraarticularly with 2:2:1 lidocaine 1%:bupivicaine 0.25%:depomedrol. Patient tolerated the procedure well without immediate complications.  Procedure, treatment alternatives, risks and benefits explained, specific risks discussed. Consent was given by the patient. Patient was prepped and draped in the usual sterile fashion.  Large Joint Inj: L knee on 10/06/2023 3:49 PM Indications: pain Details: 22 G 1.5 in needle, anterolateral approach Medications: 2 mL lidocaine 1 %; 2 mL bupivacaine 0.25 %; 40 mg methylPREDNISolone acetate 40 MG/ML Outcome: tolerated well, no immediate complications  Knee Injection, Left: After discussion on risks/benefits/indications, informed verbal consent was obtained and a timeout was performed, patient was seated on exam table. The patient's  knee was prepped with Betadine and alcohol swab and utilizing anterolateral approach, the patient's knee was injected intraarticularly with 2:2:1 lidocaine 1%:bupivicaine 0.25%:depomedrol. Patient tolerated the procedure well without immediate complications.  Procedure, treatment alternatives, risks and benefits explained, specific risks discussed. Consent was given by the patient. Patient was prepped and draped in the usual sterile fashion.          Clinical History: No specialty comments available.  She reports that she has been smoking cigarettes. She has never used smokeless tobacco. No results for input(s): "HGBA1C", "LABURIC" in the last 8760 hours.  Objective:    Physical Exam  Gen: Well-appearing, in no acute distress; non-toxic CV: Well-perfused. Warm.  Resp: Breathing unlabored on room air; no wheezing. Psych: Fluid speech in conversation; appropriate affect; normal thought process  Ortho Exam - Bilateral knees: There is a trace effusion on the right knee, no effusion of the left knee.  There is limited range of motion of bilateral knees right 0-120 degrees, left 0-130 degrees.  There is bilateral crepitus right greater than left.  There is pseudo instability with varus and valgus stress testing as well as a quite significant varus bowlegged formation of both knees.  Imaging:  01/16/23: 4 views of bilateral knee including AP standing, Rosenberg, lateral and  sunrise views were ordered and reviewed by myself today.  X-rays  demonstrate complete collapse of the medial joint space bilaterally with  severe osteoarthritic change in the medial tibiofemoral compartment as  well as moderate patellofemoral arthralgia with bony spurring.  There is  some sclerosis of the medial femoral condyle bilaterally.  Lateral joint  line relatively well-preserved.  No acute fracture noted.  There is  obvious varus formation of bilateral knees.   Past Medical/Family/Surgical/Social  History: Medications & Allergies reviewed per EMR, new medications updated. 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   Well woman exam 05/05/2011   CIGARETTE SMOKER 04/06/2010   KNEE PAIN, BILATERAL 04/06/2010   GASTROESOPHAGEAL REFLUX DISEASE, MILD 01/11/2010   OBESITY, UNSPECIFIED 12/15/2009   Sleep apnea 12/15/2009   DEPRESSION 12/14/2009   Past Medical History:  Diagnosis Date   Hypertension    Vitamin D deficiency    Family History  Problem Relation Age of Onset   Breast cancer Maternal Grandmother    Past Surgical History:  Procedure Laterality Date   TUBAL LIGATION     TUBAL LIGATION  2009   Social History   Occupational History   Not on file  Tobacco Use   Smoking status: Every Day    Current packs/day: 0.50    Types: Cigarettes   Smokeless tobacco: Never   Tobacco comments:    pt states 'i don't smoke that much'  Substance and Sexual Activity   Alcohol use: Yes    Comment: occ   Drug use: No   Sexual activity: Not on file

## 2023-10-06 NOTE — Progress Notes (Signed)
Patient advising that her Right Knee is pain full with "popping and grinding" she is advising that he left knee is also having some issues with pain. She is hoping to get an injection today.

## 2023-11-26 ENCOUNTER — Ambulatory Visit: Payer: 59 | Admitting: Obstetrics

## 2023-11-27 ENCOUNTER — Ambulatory Visit: Payer: 59 | Admitting: Obstetrics

## 2024-01-05 ENCOUNTER — Ambulatory Visit (INDEPENDENT_AMBULATORY_CARE_PROVIDER_SITE_OTHER): Payer: 59 | Admitting: Sports Medicine

## 2024-01-05 ENCOUNTER — Encounter: Payer: Self-pay | Admitting: Sports Medicine

## 2024-01-05 DIAGNOSIS — G8929 Other chronic pain: Secondary | ICD-10-CM

## 2024-01-05 DIAGNOSIS — M25562 Pain in left knee: Secondary | ICD-10-CM | POA: Diagnosis not present

## 2024-01-05 DIAGNOSIS — M25561 Pain in right knee: Secondary | ICD-10-CM

## 2024-01-05 DIAGNOSIS — Q685 Congenital bowing of long bones of leg, unspecified: Secondary | ICD-10-CM

## 2024-01-05 DIAGNOSIS — M170 Bilateral primary osteoarthritis of knee: Secondary | ICD-10-CM

## 2024-01-05 DIAGNOSIS — M25472 Effusion, left ankle: Secondary | ICD-10-CM | POA: Diagnosis not present

## 2024-01-05 MED ORDER — METHYLPREDNISOLONE ACETATE 40 MG/ML IJ SUSP
40.0000 mg | INTRAMUSCULAR | Status: AC | PRN
  Administered 2024-01-05: 40 mg via INTRA_ARTICULAR

## 2024-01-05 MED ORDER — LIDOCAINE HCL 1 % IJ SOLN
2.0000 mL | INTRAMUSCULAR | Status: AC | PRN
  Administered 2024-01-05: 2 mL

## 2024-01-05 MED ORDER — BUPIVACAINE HCL 0.25 % IJ SOLN
2.0000 mL | INTRAMUSCULAR | Status: AC | PRN
  Administered 2024-01-05: 2 mL via INTRA_ARTICULAR

## 2024-01-05 MED ORDER — MELOXICAM 15 MG PO TABS: 15.0000 mg | ORAL_TABLET | Freq: Every day | ORAL | 0 refills | Status: DC

## 2024-01-05 NOTE — Progress Notes (Signed)
 Patient says that she got pretty good relief from the injections in January. She says that they lasted about a month, and she has had some pain again since then, although the pain is not as bad as it was prior to the injections. She says that today her knees are feeling okay. She has been wearing a compression sock on the left leg for the some swelling in the left ankle, but she says that this swelling also comes and goes and she believes it is because she is on her feet so much at work.

## 2024-01-05 NOTE — Progress Notes (Signed)
 TEAIRA CROFT - 53 y.o. female MRN [id number]  Date of birth: 03/28/1971  Office Visit Note: Visit Date: 01/05/2024 PCP: Anda Bamberg, NP Referred by: Anda Bamberg, NP  Subjective: Chief Complaint  Patient presents with   Right Knee - Follow-up   Left Knee - Follow-up   HPI: GATHA MCNULTY is a pleasant 53 y.o. female who presents today for bilateral knee osteoarthritis with genu varum.  Burdette Carolin has known severe osteoarthritis of the medial tibiofemoral compartment of the knees with bowlegged deformity.  Back in January she did have knee corticosteroid injections which gave her very good relief for at least 1 month, she is still feeling better than she was previously but the effect has slowly started to wane.  She does use meloxicam  15 mg daily a few times a week when her pain is more severe.  She is on her feet constantly for work working sometimes 7 days a per week.  He does get some swelling over the lateral side of the left ankle, not painful, no injury.  Asking about it has used compression sock.  Pertinent ROS were reviewed with the patient and found to be negative unless otherwise specified above in HPI.   Assessment & Plan: Visit Diagnoses:  1. Bilateral primary osteoarthritis of knee   2. Chronic pain of both knees   3. Congenital bowed legs   4. Left ankle swelling    Plan: Impression is chronic bilateral knee pain with advanced osteoarthritis in the setting of there is/bowlegged deformity of each knee/leg.  She is dealing with an exacerbation of her pain bilaterally, although it has settled down from previous injection over 3 months ago.  Through shared decision making, we did proceed with bilateral knee corticosteroid injection.  Advised on postinjection protocol.  I did provide her an note with restrictions for work for the next 2 days.  She will continue her meloxicam  15 mg once daily as needed.  Her left ankle has great mobility and she has no effusion of  the ankle, only soft tissue swelling on the lateral component.  Recommended consistent compression socks/stockings, elevation after her long shifts.  She could be a candidate for possible orthotic cushioning in her shoes given her being on her feet, but we will hold on this for now.  She will follow-up in 3 months, may call or return sooner if any issues arise.  We did discuss given the arthritis of her knees, she could be a candidate for knee replacement, but she would like to hold off on further discussion on this for now.  Follow-up: Return in about 3 months (around 04/05/2024) for bilateral knee OA   Meds & Orders:  Meds ordered this encounter  Medications   meloxicam  (MOBIC ) 15 MG tablet    Sig: Take 1 tablet (15 mg total) by mouth daily.    Dispense:  30 tablet    Refill:  0    Orders Placed This Encounter  Procedures   Large Joint Inj   Large Joint Inj     Procedures: Large Joint Inj: R knee on 01/05/2024 4:17 PM Indications: pain Details: 22 G 1.5 in needle, anterolateral approach Medications: 2 mL lidocaine  1 %; 2 mL bupivacaine  0.25 %; 40 mg methylPREDNISolone  acetate 40 MG/ML Outcome: tolerated well, no immediate complications  Knee Injection, Right: After discussion on risks/benefits/indications, informed verbal consent was obtained and a timeout was performed, patient was seated on exam table. The patient's knee was prepped with Betadine and alcohol  swab and utilizing anterolateral approach, the patient's knee was injected intraarticularly with 2:2:1 lidocaine  1%:bupivicaine 0.25%:depomedrol. Patient tolerated the procedure well without immediate complications.  Procedure, treatment alternatives, risks and benefits explained, specific risks discussed. Consent was given by the patient. Patient was prepped and draped in the usual sterile fashion.    Large Joint Inj: L knee on 01/05/2024 4:17 PM Indications: pain Details: 22 G 1.5 in needle, anterolateral  approach Medications: 2 mL lidocaine  1 %; 2 mL bupivacaine  0.25 %; 40 mg methylPREDNISolone  acetate 40 MG/ML Outcome: tolerated well, no immediate complications  Knee Injection, Left: After discussion on risks/benefits/indications, informed verbal consent was obtained and a timeout was performed, patient was seated on exam table. The patient's knee was prepped with Betadine and alcohol swab and utilizing anterolateral approach, the patient's knee was injected intraarticularly with 2:2:1 lidocaine  1%:bupivicaine 0.25%:depomedrol. Patient tolerated the procedure well without immediate complications.  Procedure, treatment alternatives, risks and benefits explained, specific risks discussed. Consent was given by the patient. Patient was prepped and draped in the usual sterile fashion.          Clinical History: No specialty comments available.  She reports that she has been smoking cigarettes. She has never used smokeless tobacco. No results for input(s): "HGBA1C", "LABURIC" in the last 8760 hours.  Objective:    Physical Exam  Gen: Well-appearing, in no acute distress; non-toxic CV: Well-perfused. Warm.  Resp: Breathing unlabored on room air; no wheezing. Psych: Fluid speech in conversation; appropriate affect; normal thought process  Ortho Exam - Bilateral knees: There is obvious varus formation with bowlegged formation of both knees.  There is trace effusion of the right knee, no effusion of the left.  Range of motion is limited, there is bilateral patellar crepitus.  Otherwise, unchanged exam from prior visit.  Imaging:  01/16/23: 4 views of bilateral knee including AP standing, Rosenberg, lateral and  sunrise views were ordered and reviewed by myself today.  X-rays  demonstrate complete collapse of the medial joint space bilaterally with  severe osteoarthritic change in the medial tibiofemoral compartment as  well as moderate patellofemoral arthralgia with bony spurring.  There is   some sclerosis of the medial femoral condyle bilaterally.  Lateral joint  line relatively well-preserved.  No acute fracture noted.  There is  obvious varus formation of bilateral knees.   Past Medical/Family/Surgical/Social History: Medications & Allergies reviewed per EMR, new medications updated. 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   Well woman exam 05/05/2011   CIGARETTE SMOKER 04/06/2010   KNEE PAIN, BILATERAL 04/06/2010   GASTROESOPHAGEAL REFLUX DISEASE, MILD 01/11/2010   OBESITY, UNSPECIFIED 12/15/2009   Sleep apnea 12/15/2009   DEPRESSION 12/14/2009   Past Medical History:  Diagnosis Date   Hypertension    Vitamin D  deficiency    Family History  Problem Relation Age of Onset   Breast cancer Maternal Grandmother    Past Surgical History:  Procedure Laterality Date   TUBAL LIGATION     TUBAL LIGATION  2009   Social History   Occupational History   Not on file  Tobacco Use   Smoking status: Every Day    Current packs/day: 0.50    Types: Cigarettes   Smokeless tobacco: Never   Tobacco comments:    pt states 'i don't smoke that much'  Substance and Sexual Activity   Alcohol use: Yes    Comment: occ   Drug use:  No   Sexual activity: Not on file

## 2024-02-16 ENCOUNTER — Telehealth: Payer: Self-pay | Admitting: Sports Medicine

## 2024-02-16 NOTE — Telephone Encounter (Signed)
 Patient is scheduled for 02/18/2024 at 10:30am.

## 2024-02-16 NOTE — Telephone Encounter (Signed)
 Pt states she's in severe pain from her ankle up to her right knee/ thigh area. Pt requesting a urgent appointment to see Dr Vaughn Georges.

## 2024-02-18 ENCOUNTER — Ambulatory Visit: Admitting: Sports Medicine

## 2024-03-12 ENCOUNTER — Ambulatory Visit: Admitting: Obstetrics

## 2024-04-05 ENCOUNTER — Ambulatory Visit (INDEPENDENT_AMBULATORY_CARE_PROVIDER_SITE_OTHER): Admitting: Sports Medicine

## 2024-04-05 ENCOUNTER — Encounter: Payer: Self-pay | Admitting: Sports Medicine

## 2024-04-05 DIAGNOSIS — G8929 Other chronic pain: Secondary | ICD-10-CM

## 2024-04-05 DIAGNOSIS — Q685 Congenital bowing of long bones of leg, unspecified: Secondary | ICD-10-CM

## 2024-04-05 DIAGNOSIS — M170 Bilateral primary osteoarthritis of knee: Secondary | ICD-10-CM | POA: Diagnosis not present

## 2024-04-05 DIAGNOSIS — M25562 Pain in left knee: Secondary | ICD-10-CM

## 2024-04-05 DIAGNOSIS — M25561 Pain in right knee: Secondary | ICD-10-CM | POA: Diagnosis not present

## 2024-04-05 MED ORDER — METHYLPREDNISOLONE ACETATE 40 MG/ML IJ SUSP
40.0000 mg | INTRAMUSCULAR | Status: AC | PRN
  Administered 2024-04-05: 40 mg via INTRA_ARTICULAR

## 2024-04-05 MED ORDER — BUPIVACAINE HCL 0.25 % IJ SOLN
2.0000 mL | INTRAMUSCULAR | Status: AC | PRN
  Administered 2024-04-05: 2 mL via INTRA_ARTICULAR

## 2024-04-05 MED ORDER — LIDOCAINE HCL 1 % IJ SOLN
2.0000 mL | INTRAMUSCULAR | Status: AC | PRN
  Administered 2024-04-05: 2 mL

## 2024-04-05 MED ORDER — MELOXICAM 15 MG PO TABS: 15.0000 mg | ORAL_TABLET | Freq: Every day | ORAL | 1 refills | Status: DC

## 2024-04-05 NOTE — Progress Notes (Signed)
 Tonya Vaughn - 53 y.o. female MRN [id number]  Date of birth: September 11, 1971  Office Visit Note: Visit Date: 04/05/2024 PCP: Desiree Quale, NP Referred by: Desiree Quale, NP  Subjective: Chief Complaint  Patient presents with   Right Knee - Follow-up   Left Knee - Follow-up   HPI: Tonya Vaughn is a pleasant 53 y.o. female who presents today for chronic bilateral knee pain with advanced osteoarthritis.  Luke has advanced osteoarthritis of the medial tibiofemoral compartment of both knees with associated bowlegged deformity.  Just over 3 months ago we did proceed with bilateral knee corticosteroid injections and she received about 90% relief from these.  She had relief for the last few months but over the last few weeks or so the right knee has flared up causing her pain.  The left knee still causes her issue sometimes but is certainly less severe.  She would like to proceed with repeat injections today.  She does take meloxicam  15 mg a few times a week when her pain is more severe.  She currently is out of this medication and is in the process of picking up her refill.  Pertinent ROS were reviewed with the patient and found to be negative unless otherwise specified above in HPI.   Assessment & Plan: Visit Diagnoses:  1. Bilateral primary osteoarthritis of knee   2. Chronic pain of both knees   3. Congenital bowed legs    Plan: Impression is acute on chronic bilateral knee pain with advanced osteoarthritis of the medial tibiofemoral joint space of both knees, although right more significant than the left.  She has received good relief from corticosteroid injections in the past, we did repeat this for the right and the left knee today.  Postinjection protocol discussed, a note was provided for work for today and tomorrow.  I did refill her meloxicam  15 mg which she will take once daily as needed with food, does not need to take this consistently if pain is well-controlled.  She  will follow-up in about 3 months, may return or call if any issues arise prior to this.  Follow-up: Return in about 3 months (around 07/06/2024) for b/l knees   Meds & Orders:  Meds ordered this encounter  Medications   meloxicam  (MOBIC ) 15 MG tablet    Sig: Take 1 tablet (15 mg total) by mouth daily.    Dispense:  30 tablet    Refill:  1    Orders Placed This Encounter  Procedures   Large Joint Inj: R knee   Large Joint Inj: L knee     Procedures: Large Joint Inj: R knee on 04/05/2024 4:01 PM Indications: pain Details: 22 G 1.5 in needle, anterolateral approach Medications: 2 mL lidocaine  1 %; 2 mL bupivacaine  0.25 %; 40 mg methylPREDNISolone  acetate 40 MG/ML Outcome: tolerated well, no immediate complications  Knee Injection, Right: After discussion on risks/benefits/indications, informed verbal consent was obtained and a timeout was performed, patient was seated on exam table. The patient's knee was prepped with Betadine and alcohol swab and utilizing anterolateral approach, the patient's knee was injected intraarticularly with 2:2:1 lidocaine  1%:bupivicaine 0.25%:depomedrol. Patient tolerated the procedure well without immediate complications.  Procedure, treatment alternatives, risks and benefits explained, specific risks discussed. Consent was given by the patient. Patient was prepped and draped in the usual sterile fashion.    Large Joint Inj: L knee on 04/05/2024 4:01 PM Indications: pain Details: 22 G 1.5 in needle, anterolateral approach Medications: 2 mL  lidocaine  1 %; 2 mL bupivacaine  0.25 %; 40 mg methylPREDNISolone  acetate 40 MG/ML Outcome: tolerated well, no immediate complications  Knee Injection, Left: After discussion on risks/benefits/indications, informed verbal consent was obtained and a timeout was performed, patient was seated on exam table. The patient's knee was prepped with Betadine and alcohol swab and utilizing anterolateral approach, the patient's knee  was injected intraarticularly with 2:2:1 lidocaine  1%:bupivicaine 0.25%:depomedrol. Patient tolerated the procedure well without immediate complications.  Procedure, treatment alternatives, risks and benefits explained, specific risks discussed. Consent was given by the patient. Patient was prepped and draped in the usual sterile fashion.          Clinical History: No specialty comments available.  She reports that she has been smoking cigarettes. She has never used smokeless tobacco. No results for input(s): HGBA1C, LABURIC in the last 8760 hours.  Objective:    Physical Exam  Gen: Well-appearing, in no acute distress; non-toxic CV: Well-perfused. Warm.  Resp: Breathing unlabored on room air; no wheezing. Psych: Fluid speech in conversation; appropriate affect; normal thought process  Ortho Exam - Bilateral knees: + TTP medial greater than lateral joint line.  There is no significant effusion of either knee.  Range of motion 0-120 degrees left knee, 0-115 degrees right knee.  There is notable varus formation of both knees upon standing.  Imaging:  01/16/24: 4 views of bilateral knee including AP standing, Rosenberg, lateral and  sunrise views were ordered and reviewed by myself today.  X-rays  demonstrate complete collapse of the medial joint space bilaterally with  severe osteoarthritic change in the medial tibiofemoral compartment as  well as moderate patellofemoral arthralgia with bony spurring.  There is  some sclerosis of the medial femoral condyle bilaterally.  Lateral joint  line relatively well-preserved.  No acute fracture noted.  There is  obvious varus formation of bilateral knees.   Past Medical/Family/Surgical/Social History: Medications & Allergies reviewed per EMR, new medications updated. 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   Well woman exam 05/05/2011   CIGARETTE  SMOKER 04/06/2010   KNEE PAIN, BILATERAL 04/06/2010   GASTROESOPHAGEAL REFLUX DISEASE, MILD 01/11/2010   OBESITY, UNSPECIFIED 12/15/2009   Sleep apnea 12/15/2009   DEPRESSION 12/14/2009   Past Medical History:  Diagnosis Date   Hypertension    Vitamin D  deficiency    Family History  Problem Relation Age of Onset   Breast cancer Maternal Grandmother    Past Surgical History:  Procedure Laterality Date   TUBAL LIGATION     TUBAL LIGATION  2009   Social History   Occupational History   Not on file  Tobacco Use   Smoking status: Every Day    Current packs/day: 0.50    Types: Cigarettes   Smokeless tobacco: Never   Tobacco comments:    pt states 'i don't smoke that much'  Substance and Sexual Activity   Alcohol use: Yes    Comment: occ   Drug use: No   Sexual activity: Not on file

## 2024-04-05 NOTE — Progress Notes (Signed)
 Patient says that she got about 80-90% relief from her last knee injections. She says that a couple of months ago, the right knee flared up. It is not as bad now as it was then, and she says that overall her knees are not as bad as they were prior to the injections. She would like to repeat injections today. She says that last time she did not use the work note to take off of work, but she would like another note today so that she can take the day or two off to rest for the injections. She denies any new injuries.

## 2024-06-11 ENCOUNTER — Ambulatory Visit (INDEPENDENT_AMBULATORY_CARE_PROVIDER_SITE_OTHER)

## 2024-06-11 ENCOUNTER — Ambulatory Visit
Admission: EM | Admit: 2024-06-11 | Discharge: 2024-06-11 | Disposition: A | Discharge status: Home / Self Care | Source: Ambulatory Visit

## 2024-06-11 ENCOUNTER — Encounter: Payer: Self-pay | Admitting: *Deleted

## 2024-06-11 ENCOUNTER — Other Ambulatory Visit: Payer: Self-pay

## 2024-06-11 DIAGNOSIS — I10 Essential (primary) hypertension: Secondary | ICD-10-CM | POA: Diagnosis not present

## 2024-06-11 DIAGNOSIS — B349 Viral infection, unspecified: Secondary | ICD-10-CM

## 2024-06-11 MED ORDER — PROMETHAZINE-DM 6.25-15 MG/5ML PO SYRP: 5.0000 mL | ORAL_SOLUTION | Freq: Four times a day (QID) | ORAL | 0 refills | Status: DC | PRN

## 2024-06-11 MED ORDER — CLONIDINE HCL 0.2 MG PO TABS
0.2000 mg | ORAL_TABLET | Freq: Once | ORAL | Status: AC
  Administered 2024-06-11: 0.2 mg via ORAL

## 2024-06-11 MED ORDER — ALBUTEROL SULFATE HFA 108 (90 BASE) MCG/ACT IN AERS: 1.0000 | INHALATION_SPRAY | Freq: Four times a day (QID) | RESPIRATORY_TRACT | 2 refills | Status: AC | PRN

## 2024-06-11 MED ORDER — PREDNISONE 20 MG PO TABS: 40.0000 mg | ORAL_TABLET | Freq: Every day | ORAL | 0 refills | Status: AC

## 2024-06-11 NOTE — ED Notes (Signed)
 Pt states her daughter is here with her BP medications- she is going to take them now. Okay to d/c per provider

## 2024-06-11 NOTE — ED Provider Notes (Signed)
 [facility]  Note:  This document was prepared using Conservation officer, historic buildings and may include unintentional dictation errors.  MRN: [id number] DOB: Jun 29, 1971  Subjective:   Tonya Vaughn is a 53 y.o. female presenting for dry cough, wheezing, chest tightness x 2 days.  Patient reports that she is out of her albuterol  inhaler and has a history of bronchitis.  Patient denies fever, chest pain, severe shortness of breath, weakness, dizziness.  Patient states that she often has to come in for breathing treatments in the fall due to exacerbation of bronchitis.  Denies any known sick contacts.  Denies any home medications taken for symptoms.   Current Facility-Administered Medications:    cloNIDine  (CATAPRES ) tablet 0.2 mg, 0.2 mg, Oral, Once, Fleming, Zelda W, NP  Current Outpatient Medications:    amLODipine (NORVASC) 10 MG tablet, Take 10 mg by mouth daily., Disp: , Rfl:    Dapagliflozin Propanediol (FARXIGA PO), Take by mouth., Disp: , Rfl:    hydrochlorothiazide (HYDRODIURIL) 25 MG tablet, Take 25 mg by mouth daily., Disp: , Rfl:    predniSONE  (DELTASONE ) 20 MG tablet, Take 2 tablets (40 mg total) by mouth daily for 5 days., Disp: 10 tablet, Rfl: 0   promethazine -dextromethorphan (PROMETHAZINE -DM) 6.25-15 MG/5ML syrup, Take 5 mLs by mouth 4 (four) times daily as needed., Disp: 240 mL, Rfl: 0   valsartan  (DIOVAN ) 160 MG tablet, Take 1 tablet (160 mg total) by mouth daily., Disp: 90 tablet, Rfl: 3   Vitamin D , Ergocalciferol , (DRISDOL ) 1.25 MG (50000 UNIT) CAPS capsule, Take 1 capsule (50,000 Units total) by mouth every 7 (seven) days., Disp: 12 capsule, Rfl: 0   albuterol  (VENTOLIN  HFA) 108 (90 Base) MCG/ACT inhaler, Inhale 1-2 puffs into the lungs every 6 (six) hours as needed for wheezing or shortness of breath., Disp: 6.7 g, Rfl: 2   meloxicam  (MOBIC ) 15 MG tablet, Take 1 tablet (15 mg total) by mouth daily., Disp: 30 tablet, Rfl: 1   Allergies  Allergen Reactions    Lisinopril Cough    Past Medical History:  Diagnosis Date   Hypertension    Vitamin D  deficiency      Past Surgical History:  Procedure Laterality Date   TUBAL LIGATION     TUBAL LIGATION  2009    Family History  Problem Relation Age of Onset   Breast cancer Maternal Grandmother     Social History   Tobacco Use   Smoking status: Every Day    Current packs/day: 0.50    Types: Cigarettes   Smokeless tobacco: Never   Tobacco comments:    pt states 'i don't smoke that much'  Vaping Use   Vaping status: Never Used  Substance Use Topics   Alcohol use: Yes    Comment: occ   Drug use: No    ROS Refer to HPI for ROS details.  Objective:   Vitals: BP (!) 190/130   Pulse 74   Temp 98.4 F (36.9 C) (Oral)   Resp 18   SpO2 94%   Physical Exam Vitals and nursing note reviewed.  Constitutional:      General: She is not in acute distress.    Appearance: Normal appearance. She is well-developed. She is not ill-appearing or toxic-appearing.  HENT:     Head: Normocephalic and atraumatic.     Nose: Nose normal.     Mouth/Throat:     Mouth: Mucous membranes are moist.  Cardiovascular:     Rate and Rhythm: Normal rate and regular  rhythm.     Heart sounds: Normal heart sounds. No murmur heard. Pulmonary:     Effort: Pulmonary effort is normal. No respiratory distress.     Breath sounds: Normal breath sounds. No stridor. No wheezing, rhonchi or rales.  Chest:     Chest wall: No tenderness.  Skin:    General: Skin is warm and dry.  Neurological:     General: No focal deficit present.     Mental Status: She is alert and oriented to person, place, and time.  Psychiatric:        Mood and Affect: Mood normal.        Behavior: Behavior normal.     Procedures  No results found for this or any previous visit (from the past 24 hours).  DG Chest 2 View Result Date: 06/11/2024 EXAM: 2 VIEW(S) XRAY OF THE CHEST 06/11/2024 12:36:57 PM COMPARISON: 09/03/2022 CLINICAL  HISTORY: Chest congestion. Dry cough, wheezing and chest tight x 2 days. Out of her inhaler. Reports hx of bronchitis. FINDINGS: LUNGS AND PLEURA: No focal pulmonary opacity. No pulmonary edema. No pleural effusion. No pneumothorax. HEART AND MEDIASTINUM: No acute abnormality of the cardiac and mediastinal silhouettes. BONES AND SOFT TISSUES: No acute osseous abnormality. IMPRESSION: 1. Normal chest radiograph without acute cardiopulmonary process. Electronically signed by: Waddell Calk MD 06/11/2024 01:10 PM EDT RP Workstation: [id number]     Assessment and Plan :     Discharge Instructions       1. Acute viral syndrome (Primary) - DG Chest 2 View x-ray completed in UC shows no acute cardiopulmonary processes, no sign of consolidation or pneumonia, normal aeration to bilateral lungs, normal chest x-ray. - albuterol  (VENTOLIN  HFA) 108 (90 Base) MCG/ACT inhaler; Inhale 1-2 puffs into the lungs every 6 (six) hours as needed for wheezing or shortness of breath.  Dispense: 6.7 g; Refill: 2 - predniSONE  (DELTASONE ) 20 MG tablet; Take 2 tablets (40 mg total) by mouth daily for 5 days.  Dispense: 10 tablet; Refill: 0 - promethazine -dextromethorphan (PROMETHAZINE -DM) 6.25-15 MG/5ML syrup; Take 5 mLs by mouth 4 (four) times daily as needed.  Dispense: 240 mL; Refill: 0 -Continue to monitor symptoms for any change in severity if there is any escalation of current symptoms or development of new symptoms follow-up in ER for further evaluation and management.  2.  Essential hypertension -Clonidine  0.2 mg given in UC for acute hypertension. -Continue monitoring blood pressure if there is any escalation of current hypertension or development of headache, blurred vision, chest pain, shortness of breath, weakness, dizziness follow-up in ER immediately for further evaluation and treatment.       [name] [place]   Therin Vetsch, [place], [place] 06/11/24 1332

## 2024-06-11 NOTE — ED Triage Notes (Signed)
 Dry cough, wheezing and chest tight x 2 days. Out of her inhaler. Reports hx of bronchitis. Denies fever. States she usually gets a breathing treatment this time of year

## 2024-06-11 NOTE — Discharge Instructions (Addendum)
  1. Acute viral syndrome (Primary) - DG Chest 2 View x-ray completed in UC shows no acute cardiopulmonary processes, no sign of consolidation or pneumonia, normal aeration to bilateral lungs, normal chest x-ray. - albuterol  (VENTOLIN  HFA) 108 (90 Base) MCG/ACT inhaler; Inhale 1-2 puffs into the lungs every 6 (six) hours as needed for wheezing or shortness of breath.  Dispense: 6.7 g; Refill: 2 - predniSONE  (DELTASONE ) 20 MG tablet; Take 2 tablets (40 mg total) by mouth daily for 5 days.  Dispense: 10 tablet; Refill: 0 - promethazine -dextromethorphan (PROMETHAZINE -DM) 6.25-15 MG/5ML syrup; Take 5 mLs by mouth 4 (four) times daily as needed.  Dispense: 240 mL; Refill: 0 -Continue to monitor symptoms for any change in severity if there is any escalation of current symptoms or development of new symptoms follow-up in ER for further evaluation and management.  2.  Essential hypertension -Clonidine  0.2 mg given in UC for acute hypertension. -Continue monitoring blood pressure if there is any escalation of current hypertension or development of headache, blurred vision, chest pain, shortness of breath, weakness, dizziness follow-up in ER immediately for further evaluation and treatment.

## 2024-06-22 ENCOUNTER — Ambulatory Visit (INDEPENDENT_AMBULATORY_CARE_PROVIDER_SITE_OTHER): Admitting: Obstetrics

## 2024-06-22 ENCOUNTER — Encounter: Payer: Self-pay | Admitting: Obstetrics

## 2024-06-22 ENCOUNTER — Other Ambulatory Visit (HOSPITAL_COMMUNITY)
Admission: RE | Admit: 2024-06-22 | Discharge: 2024-06-22 | Disposition: A | Discharge status: Home / Self Care | Source: Ambulatory Visit | Attending: Obstetrics | Admitting: Obstetrics

## 2024-06-22 VITALS — BP 180/105 | HR 71 | Ht 64.17 in | Wt 231.8 lb

## 2024-06-22 DIAGNOSIS — N941 Unspecified dyspareunia: Secondary | ICD-10-CM | POA: Diagnosis present

## 2024-06-22 DIAGNOSIS — F17200 Nicotine dependence, unspecified, uncomplicated: Secondary | ICD-10-CM | POA: Diagnosis not present

## 2024-06-22 DIAGNOSIS — R351 Nocturia: Secondary | ICD-10-CM | POA: Diagnosis not present

## 2024-06-22 DIAGNOSIS — N3946 Mixed incontinence: Secondary | ICD-10-CM | POA: Diagnosis not present

## 2024-06-22 DIAGNOSIS — E66812 Obesity, class 2: Secondary | ICD-10-CM

## 2024-06-22 DIAGNOSIS — Z6839 Body mass index (BMI) 39.0-39.9, adult: Secondary | ICD-10-CM

## 2024-06-22 LAB — POCT URINALYSIS DIP (CLINITEK)
Bilirubin, UA: NEGATIVE
Blood, UA: NEGATIVE
Glucose, UA: NEGATIVE mg/dL
Ketones, POC UA: NEGATIVE mg/dL
Leukocytes, UA: NEGATIVE
Nitrite, UA: NEGATIVE
Spec Grav, UA: 1.025 (ref 1.010–1.025)
Urobilinogen, UA: 1 U/dL
pH, UA: 5.5 (ref 5.0–8.0)

## 2024-06-22 MED ORDER — TROSPIUM CHLORIDE ER 60 MG PO CP24: 1.0000 | ORAL_CAPSULE | Freq: Every day | ORAL | 2 refills | Status: AC

## 2024-06-22 NOTE — Assessment & Plan Note (Signed)
-   changes in diet since moving to a hotel 1 year ago - discussed association with pelvic floor disorders - encouraged weight reduction with diet modification and increased activity

## 2024-06-22 NOTE — Assessment & Plan Note (Addendum)
-   pain with deep penetration in suprapubic region - Nuswab to r/o infectious etiology - discussed fluid management  - encouraged pelvic floor relaxation exercises

## 2024-06-22 NOTE — Assessment & Plan Note (Signed)
-   POCT UA + bilirubin, PVR 53mL - urgency > stress - We discussed the symptoms of overactive bladder (OAB), which include urinary urgency, urinary frequency, nocturia, with or without urge incontinence.  While we do not know the exact etiology of OAB, several treatment options exist. We discussed management including behavioral therapy (decreasing bladder irritants, urge suppression strategies, timed voids, bladder retraining), physical therapy, medication; for refractory cases posterior tibial nerve stimulation, sacral neuromodulation, and intravesical botulinum toxin injection.  For anticholinergic medications, we discussed the potential side effects of anticholinergics including dry eyes, dry mouth, constipation, cognitive impairment and urinary retention. For Beta-3 agonist medication, we discussed the potential side effect of elevated blood pressure which is more likely to occur in individuals with uncontrolled hypertension. - discussed fluid management and weight reduction - Rx Trospium, patient to call and signup for [employer] if cost prohibitive (gemtesa and mirabegron not covered) - For treatment of stress urinary incontinence,  non-surgical options include expectant management, weight loss, physical therapy, as well as a pessary.  Surgical options include a midurethral sling, Burch urethropexy, and transurethral injection of a bulking agent.

## 2024-06-22 NOTE — Assessment & Plan Note (Signed)
-   encouraged tobacco cessation due to stress urinary leakage
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
## 2024-06-22 NOTE — Patient Instructions (Addendum)
 We discussed the symptoms of overactive bladder (OAB), which include urinary urgency, urinary frequency, night-time urination, with or without urge incontinence.  We discussed management including behavioral therapy (decreasing bladder irritants by following a bladder diet, urge suppression strategies, timed voids, bladder retraining), physical therapy, medication; and for refractory cases posterior tibial nerve stimulation, sacral neuromodulation, and intravesical botulinum toxin injection.   For anticholinergic medications, we discussed the potential side effects of anticholinergics including dry eyes, dry mouth, constipation, rare risks of cognitive impairment and urinary retention. You were given prescription for Trospium.  It can take a month to start working so give it time, but if you have bothersome side effects call sooner and we can try a different medication.  Call us  if you have trouble filling the prescription or if it's not covered by your insurance.  Please visit the website below to sign up for an account. We will need an email address to send along with your prescription to verify your prescription once you have signed up.  [email or web address]  Trospium Chloride ER Capsule Extended Release  60mg   30 count $31.11  Or    Trospium Chloride (2 times a day dosing) Tablet  20mg   60 count  $14.03  For night time frequency: - avoid fluid intake 3 hours before bedtime - elevated your feet during the day or use compression socks to reduce lower extremity swelling - switch your diuretic (e.g. hydrochlorothiazide) dosing to 2pm - Reports snoring, encouraged to complete workup for sleep apnea  The origin of pelvic floor muscle spasm can be multifactorial, including primary, reactive to a different pain source, trauma, or even part of a centralized pain syndrome.Treatment options include pelvic floor physical therapy, local (vaginal) or oral  muscle relaxants,  pelvic muscle trigger point injections or centrally acting pain medications.

## 2024-06-22 NOTE — Assessment & Plan Note (Addendum)
-   avoid fluid intake 3 hours before bedtime - elevated feet during the day or use compression socks to reduce lower extremity swelling - switch diuretic (e.g. HCTZ) dosing to 2pm - due to snoring, encouraged to follow-up with sleep study to start CPAP for sleep apnea due to nocturia and sub-optimal BP control - discussed fluid management

## 2024-06-23 ENCOUNTER — Ambulatory Visit: Payer: Self-pay | Admitting: Obstetrics

## 2024-06-23 DIAGNOSIS — N941 Unspecified dyspareunia: Secondary | ICD-10-CM

## 2024-06-23 LAB — CERVICOVAGINAL ANCILLARY ONLY
Bacterial Vaginitis (gardnerella): POSITIVE — AB
Candida Glabrata: NEGATIVE
Candida Vaginitis: NEGATIVE
Comment: NEGATIVE
Comment: NEGATIVE
Comment: NEGATIVE

## 2024-06-23 MED ORDER — METRONIDAZOLE 500 MG PO TABS: 500.0000 mg | ORAL_TABLET | Freq: Three times a day (TID) | ORAL | 0 refills | Status: AC

## 2024-06-24 ENCOUNTER — Ambulatory Visit: Admitting: Obstetrics

## 2024-06-24 ENCOUNTER — Telehealth: Payer: Self-pay | Admitting: *Deleted

## 2024-06-24 NOTE — Telephone Encounter (Signed)
 PA initiated through [employer] for Trospium mg. KD CMA

## 2024-07-06 ENCOUNTER — Ambulatory Visit: Admitting: Sports Medicine

## 2024-07-19 ENCOUNTER — Encounter: Payer: Self-pay | Admitting: Radiology

## 2024-07-22 ENCOUNTER — Ambulatory Visit: Admitting: Sports Medicine

## 2024-07-29 ENCOUNTER — Ambulatory Visit: Admitting: Sports Medicine

## 2024-08-06 ENCOUNTER — Ambulatory Visit
Admission: EM | Admit: 2024-08-06 | Discharge: 2024-08-06 | Disposition: A | Discharge status: Home / Self Care | Attending: Family Medicine | Admitting: Family Medicine

## 2024-08-06 ENCOUNTER — Encounter: Payer: Self-pay | Admitting: *Deleted

## 2024-08-06 ENCOUNTER — Ambulatory Visit (INDEPENDENT_AMBULATORY_CARE_PROVIDER_SITE_OTHER)

## 2024-08-06 DIAGNOSIS — R0989 Other specified symptoms and signs involving the circulatory and respiratory systems: Secondary | ICD-10-CM

## 2024-08-06 DIAGNOSIS — J069 Acute upper respiratory infection, unspecified: Secondary | ICD-10-CM | POA: Diagnosis not present

## 2024-08-06 DIAGNOSIS — J441 Chronic obstructive pulmonary disease with (acute) exacerbation: Secondary | ICD-10-CM

## 2024-08-06 LAB — POC SOFIA SARS ANTIGEN FIA: SARS Coronavirus 2 Ag: NEGATIVE

## 2024-08-06 MED ORDER — PREDNISONE 20 MG PO TABS: 40.0000 mg | ORAL_TABLET | Freq: Every day | ORAL | 0 refills | Status: AC

## 2024-08-06 MED ORDER — BENZONATATE 100 MG PO CAPS: 100.0000 mg | ORAL_CAPSULE | Freq: Three times a day (TID) | ORAL | 0 refills | Status: AC | PRN

## 2024-08-06 NOTE — Discharge Instructions (Signed)
 The COVID test is negative  The radiologist did not see any pneumonia on your x-ray  Take prednisone  20 mg--2 daily for 5 days  Take benzonatate  100 mg, 1 tab every 8 hours as needed for cough.

## 2024-08-06 NOTE — ED Provider Notes (Signed)
 [facility]    CSN: [id number] Arrival date & time: 08/06/24  1458      History   Chief Complaint Chief Complaint  Patient presents with   Cough    HPI Tonya Vaughn is a 53 y.o. female.    Cough  Here for cough and nasal congestion.  Feeling tired and weak.  Symptoms began on November 16.  She has also had upper back and rib pain.  She has felt worse some in the last 24 hours and feels she has a lot of congestion in her chest.  She is bringing up green mucus.  She states she does have an inhaler to use at home. She has had some wheezing  no vomiting or diarrhea.  She is allergic to lisinopril Past Medical History:  Diagnosis Date   Hypertension    Vitamin D  deficiency     Patient Active Problem List   Diagnosis Date Noted   Urinary incontinence, mixed 06/22/2024   Nocturia 06/22/2024   Dyspareunia in female 06/22/2024   Diabetes mellitus screening 10/08/2016   Post-viral cough syndrome 09/24/2011   Well woman exam 05/05/2011   CIGARETTE SMOKER 04/06/2010   KNEE PAIN, BILATERAL 04/06/2010   GASTROESOPHAGEAL REFLUX DISEASE, MILD 01/11/2010   Obesity, unspecified 12/15/2009   Sleep apnea 12/15/2009   DEPRESSION 12/14/2009    Past Surgical History:  Procedure Laterality Date   TUBAL LIGATION  2009    OB History     Gravida  6   Para  5   Term  5   Preterm      AB      Living  5      SAB      IAB      Ectopic      Multiple      Live Births  5            Home Medications    Prior to Admission medications   Medication Sig Start Date End Date Taking? Authorizing Provider  albuterol  (VENTOLIN  HFA) 108 (90 Base) MCG/ACT inhaler Inhale 1-2 puffs into the lungs every 6 (six) hours as needed for wheezing or shortness of breath. 06/11/24  Yes Reddick, Johnathan B, NP  amLODipine (NORVASC) 10 MG tablet Take 10 mg by mouth daily. 05/27/24  Yes [provider]  benzonatate  (TESSALON ) 100 MG capsule Take 1 capsule  (100 mg total) by mouth 3 (three) times daily as needed for cough. 08/06/24  Yes Elbony Mcclimans K, MD  hydrochlorothiazide (HYDRODIURIL) 25 MG tablet Take 25 mg by mouth daily.   Yes [provider]  predniSONE  (DELTASONE ) 20 MG tablet Take 2 tablets (40 mg total) by mouth daily with breakfast for 5 days. 08/06/24 08/11/24 Yes Vonna Sharlet POUR, MD  valsartan  (DIOVAN ) 160 MG tablet Take 1 tablet (160 mg total) by mouth daily. 06/04/22  Yes Theotis Haze ORN, NP  Vitamin D , Ergocalciferol , (DRISDOL ) 1.25 MG (50000 UNIT) CAPS capsule Take 1 capsule (50,000 Units total) by mouth every 7 (seven) days. 06/04/22  Yes Fleming, Zelda W, NP  Dapagliflozin Propanediol (FARXIGA PO) Take by mouth.    [provider]  Trospium  Chloride 60 MG CP24 Take 1 capsule (60 mg total) by mouth daily. 06/22/24   Guadlupe Lianne DASEN, MD    Family History Family History  Problem Relation Age of Onset   Breast cancer Maternal Grandmother    Bladder Cancer Neg Hx    Renal cancer Neg Hx    Uterine  cancer Neg Hx     Social History Social History   Tobacco Use   Smoking status: Some Days    Current packs/day: 0.50    Types: Cigarettes   Smokeless tobacco: Never   Tobacco comments:    pt states 'i don't smoke that much'  Vaping Use   Vaping status: Never Used  Substance Use Topics   Alcohol use: Yes    Comment: occ   Drug use: No     Allergies   Lisinopril   Review of Systems Review of Systems  Respiratory:  Positive for cough.      Physical Exam Triage Vital Signs ED Triage Vitals  Encounter Vitals Group     BP 08/06/24 1515 (!) 133/90     Girls Systolic BP Percentile --      Girls Diastolic BP Percentile --      Boys Systolic BP Percentile --      Boys Diastolic BP Percentile --      Pulse Rate 08/06/24 1515 76     Resp 08/06/24 1515 18     Temp 08/06/24 1515 98.6 F (37 C)     Temp Source 08/06/24 1515 Oral     SpO2 08/06/24 1515 97 %     Weight --      Height --      Head  Circumference --      Peak Flow --      Pain Score 08/06/24 1512 2     Pain Loc --      Pain Education --      Exclude from Growth Chart --    No data found.  Updated Vital Signs BP (!) 133/90 (BP Location: Left Arm)   Pulse 76   Temp 98.6 F (37 C) (Oral)   Resp 18   SpO2 97%   Visual Acuity Right Eye Distance:   Left Eye Distance:   Bilateral Distance:    Right Eye Near:   Left Eye Near:    Bilateral Near:     Physical Exam Vitals reviewed.  Constitutional:      General: She is not in acute distress.    Appearance: She is not toxic-appearing.  HENT:     Right Ear: Tympanic membrane and ear canal normal.     Left Ear: Tympanic membrane and ear canal normal.     Nose: Congestion present.     Mouth/Throat:     Mouth: Mucous membranes are moist.     Pharynx: No posterior oropharyngeal erythema.  Eyes:     Extraocular Movements: Extraocular movements intact.     Conjunctiva/sclera: Conjunctivae normal.     Pupils: Pupils are equal, round, and reactive to light.  Cardiovascular:     Rate and Rhythm: Normal rate and regular rhythm.     Heart sounds: No murmur heard. Pulmonary:     Effort: No respiratory distress.     Breath sounds: No stridor. No rhonchi or rales.     Comments: No wheezing at this time Musculoskeletal:     Cervical back: Neck supple.  Lymphadenopathy:     Cervical: No cervical adenopathy.  Skin:    Capillary Refill: Capillary refill takes less than 2 seconds.     Coloration: Skin is not jaundiced or pale.  Neurological:     General: No focal deficit present.     Mental Status: She is alert and oriented to person, place, and time.  Psychiatric:        Behavior: Behavior  normal.      UC Treatments / Results  Labs (all labs ordered are listed, but only abnormal results are displayed) Labs Reviewed  POC SOFIA SARS ANTIGEN FIA - Normal    EKG   Radiology DG Chest 2 View Result Date: 08/06/2024 CLINICAL DATA:  Chest congestion with  cough. EXAM: CHEST - 2 VIEW COMPARISON:  Chest x-ray 06/11/2024 FINDINGS: The heart size and mediastinal contours are within normal limits. Both lungs are clear. The visualized skeletal structures are unremarkable. IMPRESSION: No active cardiopulmonary disease. Electronically Signed   By: Greig Pique M.D.   On: 08/06/2024 15:51    Procedures Procedures (including critical care time)  Medications Ordered in UC Medications - No data to display  Initial Impression / Assessment and Plan / UC Course  I have reviewed the triage vital signs and the nursing notes.  Pertinent labs & imaging results that were available during my care of the patient were reviewed by me and considered in my medical decision making (see chart for details).     COVID test is negative  Chest x-ray is clear  Tessalon  Perles are sent in for cough and a 5-day burst of prednisone  is sent in for the COPD exacerbation Final Clinical Impressions(s) / UC Diagnoses   Final diagnoses:  Viral URI  Chest congestion  COPD exacerbation (HCC)     Discharge Instructions      The COVID test is negative  The radiologist did not see any pneumonia on your x-ray  Take prednisone  20 mg--2 daily for 5 days  Take benzonatate  100 mg, 1 tab every 8 hours as needed for cough.       ED Prescriptions     Medication Sig Dispense Auth. Provider   predniSONE  (DELTASONE ) 20 MG tablet Take 2 tablets (40 mg total) by mouth daily with breakfast for 5 days. 10 tablet Vonna Sharlet POUR, MD   benzonatate  (TESSALON ) 100 MG capsule Take 1 capsule (100 mg total) by mouth 3 (three) times daily as needed for cough. 21 capsule Aunica Dauphinee K, MD      PDMP not reviewed this encounter.   Vonna Sharlet POUR, MD 08/06/24 [phone]

## 2024-08-06 NOTE — ED Triage Notes (Signed)
 Congestion, cough, feel weak since Sunday. C/o pain in upper back and ribs. She has been taking tylenol . Cough is sometimes productive for green phlegm. She needs a note for work also.

## 2024-08-27 ENCOUNTER — Other Ambulatory Visit: Payer: Self-pay | Admitting: Student

## 2024-08-27 DIAGNOSIS — Z1231 Encounter for screening mammogram for malignant neoplasm of breast: Secondary | ICD-10-CM

## 2024-09-23 ENCOUNTER — Ambulatory Visit

## 2024-09-29 NOTE — Progress Notes (Unsigned)
 [facility] Return Visit  SUBJECTIVE  History of Present Illness: Tonya Vaughn is a 54 y.o. female seen in follow-up for mixed urinary incontinence, tobacco use, nocturia, dyspareunia, and BMI. Plan at last visit was ***trial of Trospium , weight reduction, switching hydrochlorothiazide to 2pm, fluid management, and tobacco cessation.   Leaks 3 time(s) per days with urgency, more bothersome Leaks 2x/day with coughing/sneeze, laughing Pad use: 2 adult diapers per day.   Nuswab + bacterial vaginosis 06/22/24 Day time voids 4-6.  Nocturia: 4 times per night to void started a few months ago Denies CPAP use with history of sleep apnea since she did not return for sleep study Drinks fluid throughout the night due to dry mouth  Intermittent ankle swelling Drinks: 126oz water per day, 8oz coffee, intermittent soda with worsening urinary frequency   Past Medical History: Patient  has a past medical history of Hypertension and Vitamin D  deficiency.   Past Surgical History: She  has a past surgical history that includes Tubal ligation (2009).   Medications: She has a current medication list which includes the following prescription(s): albuterol , amlodipine, benzonatate , dapagliflozin propanediol, hydrochlorothiazide, trospium  chloride, valsartan , and vitamin d  (ergocalciferol ), and the following Facility-Administered Medications: clonidine .   Allergies: Patient is allergic to lisinopril.   Social History: Patient  reports that she has been smoking cigarettes. She has never used smokeless tobacco. She reports current alcohol use. She reports that she does not use drugs.     OBJECTIVE     Physical Exam: There were no vitals filed for this visit. Gen: No apparent distress, A&O x 3.  Detailed Urogynecologic Evaluation:  Deferred. Prior exam showed:      No data to display             ASSESSMENT AND PLAN    Tonya Vaughn is a 54 y.o. with:  No diagnosis found.  There  are no diagnoses linked to this encounter.   Lianne ONEIDA Gillis, MD

## 2024-09-30 ENCOUNTER — Ambulatory Visit: Admitting: Sports Medicine

## 2024-09-30 ENCOUNTER — Ambulatory Visit: Admitting: Obstetrics

## 2024-10-07 ENCOUNTER — Ambulatory Visit

## 2024-10-14 ENCOUNTER — Ambulatory Visit: Admitting: Sports Medicine

## 2024-10-21 ENCOUNTER — Ambulatory Visit
Admission: RE | Admit: 2024-10-21 | Discharge: 2024-10-21 | Disposition: A | Source: Ambulatory Visit | Attending: Student

## 2024-10-21 ENCOUNTER — Encounter: Payer: Self-pay | Admitting: Sports Medicine

## 2024-10-21 ENCOUNTER — Ambulatory Visit: Admitting: Sports Medicine

## 2024-10-21 DIAGNOSIS — Q685 Congenital bowing of long bones of leg, unspecified: Secondary | ICD-10-CM

## 2024-10-21 DIAGNOSIS — G8929 Other chronic pain: Secondary | ICD-10-CM

## 2024-10-21 DIAGNOSIS — Z1231 Encounter for screening mammogram for malignant neoplasm of breast: Secondary | ICD-10-CM

## 2024-10-21 DIAGNOSIS — M170 Bilateral primary osteoarthritis of knee: Secondary | ICD-10-CM

## 2024-10-21 MED ORDER — METHYLPREDNISOLONE ACETATE 40 MG/ML IJ SUSP
60.0000 mg | INTRAMUSCULAR | Status: AC | PRN
  Administered 2024-10-21: 60 mg via INTRA_ARTICULAR

## 2024-10-21 MED ORDER — LIDOCAINE HCL 1 % IJ SOLN
2.0000 mL | INTRAMUSCULAR | Status: AC | PRN
  Administered 2024-10-21: 2 mL

## 2024-10-21 MED ORDER — MELOXICAM 15 MG PO TABS: 15.0000 mg | ORAL_TABLET | Freq: Every day | ORAL | 0 refills | Status: AC

## 2024-10-21 MED ORDER — BUPIVACAINE HCL 0.25 % IJ SOLN
2.0000 mL | INTRAMUSCULAR | Status: AC | PRN
  Administered 2024-10-21: 2 mL via INTRA_ARTICULAR

## 2024-10-21 NOTE — Progress Notes (Signed)
 Patient says that she got about 3 months of good relief from the last injections. She says that her knees have been bothering her again, especially if she has to get up in the middle of the night. They feel okay because she has been up and moving, but says that even prolonged sitting becomes stiff and painful.

## 2024-10-21 NOTE — Progress Notes (Signed)
 "  Tonya Vaughn - 54 y.o. female MRN [id number]  Date of birth: 05-11-1971  Office Visit Note: Visit Date: 10/21/2024 PCP: Desiree Quale, NP Referred by: Desiree Quale, NP  Subjective: Chief Complaint  Patient presents with   Right Knee - Pain   Left Knee - Pain   HPI: Tonya Vaughn is a pleasant 54 y.o. female who presents today for f/u of chronic bilateral knee pain with severe OA.  Lab Results  Component Value Date   HGBA1C 5.6 10/08/2016   Discussed the use of AI scribe software for clinical note transcription with the patient, who gave verbal consent to proceed.  History of Present Illness Tonya Vaughn is a 54 year old female with bilateral primary knee osteoarthritis who presents for follow-up of chronic bilateral knee pain.  Her last knee injections in July 2025 gave significant relief, but over the past several weeks to months her bilateral knee pain has gradually returned.  The pain is worse on one side, especially when rising from bed at night, sometimes forcing her to sit back down before walking. She has nocturnal knee pain that can improve when she rubs her knees together. She does not currently use a wedge or pillow between her legs at night.  She previously used PRN meloxicam  with benefit but is not using NSAIDs, braces, or compression sleeves now. She denies recent trauma or new injury.  She has maintained her usual activity level and has not had significant weight gain.  Pertinent ROS were reviewed with the patient and found to be negative unless otherwise specified above in HPI.   Assessment & Plan: Visit Diagnoses:  1. Bilateral primary osteoarthritis of knee   2. Chronic pain of both knees   3. Congenital bowed legs    Assessment & Plan Bilateral primary osteoarthritis of knee - acute on chronic exacerbation Severe bilateral knee osteoarthritis, predominantly medial compartments. Symptoms recurred post-initial improvement with  injections. She opts for conservative management over surgery. - Administered bilateral intra-articular CSI knee injections.  Postinjection protocol discussed. - Advised limited activity for next 48 hrs post-injections. -May use meloxicam  15 mg once daily for short 3 -4 day bursts and then to as needed as needed - Recommended follow-up in three months or sooner if symptoms worsen. - Instructed to contact office with any changes or concerns. - Arthritic change specifically in the medial tibiofemoral joint space is severe, ultimate cure would be knee replacements but given response to corticosteroid injections and oral medications, holding on this discussion for now  Chronic pain of both knees Chronic bilateral knee pain with intermittent flares, more pronounced nocturnally and with activity. Managed with periodic injections and as-needed NSAIDs. - Prescribed meloxicam  15 mg as needed for flares, limited to 3-4 days. - Reinforced avoidance of daily or long-term meloxicam  use; restrict to symptomatic flares. - Discussed use of thin pillow or wedge between knees at night.  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 Managed as part of osteoarthritis treatment. - Reinforced relationship between congenital genu varum and knee osteoarthritis. Given congenital nature, unloader brace likely not of benefit. - No new interventions required; continue current management.   Follow-up: Return in about 3 months (around 01/18/2025) for B/l knee with injs.   Meds & Orders:  Meds ordered this encounter  Medications   meloxicam  (MOBIC ) 15 MG tablet    Sig: Take 1 tablet (15 mg total) by mouth daily.    Dispense:  30 tablet  Refill:  0    Orders Placed This Encounter  Procedures   Large Joint Inj   Large Joint Inj     Procedures: Large Joint Inj: R knee on 10/21/2024 1:56 PM Indications: pain Details: 22 G 1.5 in needle, anterolateral  approach Medications: 2 mL lidocaine  1 %; 2 mL bupivacaine  0.25 %; 60 mg methylPREDNISolone  acetate 40 MG/ML Outcome: tolerated well, no immediate complications  Knee Injection, Right: After discussion on risks/benefits/indications, informed verbal consent was obtained and a timeout was performed, patient was seated on exam table. The patient's knee was prepped with Betadine and alcohol swab and utilizing anterolateral approach, the patient's knee was injected intraarticularly with 2:2:1.5 lidocaine  1%:bupivicaine 0.25%:depomedrol. Patient tolerated the procedure well without immediate complications.  Procedure, treatment alternatives, risks and benefits explained, specific risks discussed. Consent was given by the patient. Patient was prepped and draped in the usual sterile fashion.    Large Joint Inj: L knee on 10/21/2024 1:56 PM Indications: pain Details: 22 G 1.5 in needle, anterolateral approach Medications: 2 mL lidocaine  1 %; 2 mL bupivacaine  0.25 %; 60 mg methylPREDNISolone  acetate 40 MG/ML Outcome: tolerated well, no immediate complications  Knee Injection, Left: After discussion on risks/benefits/indications, informed verbal consent was obtained and a timeout was performed, patient was seated on exam table. The patient's knee was prepped with Betadine and alcohol swab and utilizing anterolateral approach, the patient's knee was injected intraarticularly with 2:2:1.5 lidocaine  1%:bupivicaine 0.25%:depomedrol. Patient tolerated the procedure well without immediate complications. Procedure, treatment alternatives, risks and benefits explained, specific risks discussed. Consent was given by the patient. Patient was prepped and draped in the usual sterile fashion.          Clinical History: No specialty comments available.  She reports that she has been smoking cigarettes. She has never used smokeless tobacco. No results for input(s): HGBA1C, LABURIC in the last 8760  hours.  Objective:    Physical Exam  Gen: Well-appearing, in no acute distress; non-toxic CV: Well-perfused. Warm.  Resp: Breathing unlabored on room air; no wheezing. Psych: Fluid speech in conversation; appropriate affect; normal thought process  *MSK/Ortho Exam: Physical Exam  - Bilateral knees: No significant redness swelling or effusion of either knee.  There is notable varus formation of both knees, worse with standing.  Positive TTP over the medial greater than lateral joint line. Range of motion 0-120 degrees left knee, 0-115 degrees right knee.  Mild patellofemoral crepitus bilaterally.   MUSCULOSKELETAL: Knees: Tenderness on the medial aspect and inflammation on the lateral aspect.  Imaging:  *I did personally review right and left knee x-rays independently and with the patient in the room today.  01/16/23: 4 views of bilateral knee including AP standing, Rosenberg, lateral and  sunrise views were ordered and reviewed by myself today.  X-rays  demonstrate complete collapse of the medial joint space bilaterally with  severe osteoarthritic change in the medial tibiofemoral compartment as  well as moderate patellofemoral arthralgia with bony spurring.  There is  some sclerosis of the medial femoral condyle bilaterally.  Lateral joint  line relatively well-preserved.  No acute fracture noted.  There is  obvious varus formation of bilateral knees.   Past Medical/Family/Surgical/Social History: Medications & Allergies reviewed per EMR, new medications updated. Patient Active Problem List   Diagnosis Date Noted   Urinary incontinence, mixed 06/22/2024   Nocturia 06/22/2024   Dyspareunia in female 06/22/2024   Diabetes mellitus screening 10/08/2016   Post-viral cough syndrome 09/24/2011   Well woman exam 05/05/2011  CIGARETTE SMOKER 04/06/2010   KNEE PAIN, BILATERAL 04/06/2010   GASTROESOPHAGEAL REFLUX DISEASE, MILD 01/11/2010   Obesity, unspecified 12/15/2009   Sleep  apnea 12/15/2009   DEPRESSION 12/14/2009   Past Medical History:  Diagnosis Date   Hypertension    Vitamin D  deficiency    Family History  Problem Relation Age of Onset   Breast cancer Maternal Grandmother    Bladder Cancer Neg Hx    Renal cancer Neg Hx    Uterine cancer Neg Hx    Past Surgical History:  Procedure Laterality Date   TUBAL LIGATION  2009   Social History   Occupational History   Not on file  Tobacco Use   Smoking status: Some Days    Current packs/day: 0.50    Types: Cigarettes   Smokeless tobacco: Never   Tobacco comments:    pt states 'i don't smoke that much'  Vaping Use   Vaping status: Never Used  Substance and Sexual Activity   Alcohol use: Yes    Comment: occ   Drug use: No   Sexual activity: Yes    Partners: Male    Birth control/protection: None, Surgical   "

## 2024-11-18 ENCOUNTER — Ambulatory Visit: Admitting: Obstetrics

## 2025-01-20 ENCOUNTER — Ambulatory Visit: Admitting: Sports Medicine
